# Patient Record
Sex: Male | Born: 1965 | Race: White | Hispanic: No
Health system: Western US, Academic
[De-identification: ages and names within clinical notes are randomized; demographics above are authoritative.]

## PROBLEM LIST (undated history)

## (undated) DIAGNOSIS — I509 Heart failure, unspecified: Secondary | ICD-10-CM

## (undated) DIAGNOSIS — I2510 Atherosclerotic heart disease of native coronary artery without angina pectoris: Secondary | ICD-10-CM

## (undated) DIAGNOSIS — I82C19 Acute embolism and thrombosis of unspecified internal jugular vein: Secondary | ICD-10-CM

## (undated) DIAGNOSIS — N186 End stage renal disease: Secondary | ICD-10-CM

## (undated) DIAGNOSIS — L0390 Cellulitis, unspecified: Secondary | ICD-10-CM

## (undated) DIAGNOSIS — F32A Depression, unspecified: Secondary | ICD-10-CM

## (undated) DIAGNOSIS — G629 Polyneuropathy, unspecified: Secondary | ICD-10-CM

## (undated) DIAGNOSIS — I10 Essential (primary) hypertension: Secondary | ICD-10-CM

## (undated) DIAGNOSIS — G43909 Migraine, unspecified, not intractable, without status migrainosus: Secondary | ICD-10-CM

## (undated) DIAGNOSIS — I82409 Acute embolism and thrombosis of unspecified deep veins of unspecified lower extremity: Secondary | ICD-10-CM

## (undated) DIAGNOSIS — I871 Compression of vein: Secondary | ICD-10-CM

## (undated) HISTORY — PX: TUNNELED VENOUS CATHETER PLACEMENT: SHX818

## (undated) HISTORY — PX: SURGICAL HX OTHER: 99

---

## 2000-03-09 ENCOUNTER — Encounter: Payer: Self-pay | Admitting: Ophthalmology

## 2000-03-13 ENCOUNTER — Encounter (HOSPITAL_BASED_OUTPATIENT_CLINIC_OR_DEPARTMENT_OTHER): Payer: Self-pay | Admitting: Orthopaedic Surgery

## 2011-07-01 ENCOUNTER — Ambulatory Visit: Payer: Medicare Other | Attending: Nephrology | Admitting: Nephrology

## 2011-07-01 DIAGNOSIS — F17200 Nicotine dependence, unspecified, uncomplicated: Secondary | ICD-10-CM | POA: Insufficient documentation

## 2011-07-01 DIAGNOSIS — I120 Hypertensive chronic kidney disease with stage 5 chronic kidney disease or end stage renal disease: Secondary | ICD-10-CM | POA: Insufficient documentation

## 2011-07-01 DIAGNOSIS — N186 End stage renal disease: Secondary | ICD-10-CM

## 2019-10-28 ENCOUNTER — Encounter (HOSPITAL_COMMUNITY): Payer: Self-pay

## 2019-10-29 NOTE — Historical Transplant Studies (Signed)
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

## 2019-10-29 NOTE — Historical Transplant Communications (Signed)
[place], Baumgartner [phone])  Transplant Communications  ____________________________________________________________________________________    Date: 07/24/2011  Type: Medical info  Entry By: Vista Mink  Re: 07/24/11 outside note to scan of type  - Outside Records/General.  ____________________________________________________________________________________    Date: 07/23/2011  Type: Medical info  Entry By: Claud Kelp  Re: 07/23/11 outside note to scan of type  - Outside Records/General.  ____________________________________________________________________________________    Date: 07/15/2011  Type: Action  Entry By: [email or web address]  Closing chart and referral.  ____________________________________________________________________________________    Date: 06/30/2011  Type: Chart in review  Entry By: jharder  TC from pt's girlfriend.  Wondering if they live too far away for the outpt f/u.  They live in [place] and can't afford rent prices closer to [place].  Told them that the pt would have to have very reliable transportation and transport provider if he is to remain in [place].  They will consider their options.  ____________________________________________________________________________________    Date: 06/30/2011  Type: Action  Entry By: Ashley Royalty  Spoke to pt and confirmed tomorrow's appt. Planning to attend as scheduled.  ____________________________________________________________________________________    Date: 06/19/2011  Type: Action  Entry By: [email or web address]  Sending out packet and faxing RMD.  ____________________________________________________________________________________    Date: 06/13/2011  Type: Medical info  Entry By: [email or web address]  Scheduled pt for initial appt on 07/01/11 at 3pm with Dr. Thayer Dallas.  ____________________________________________________________________________________    Date: 06/12/2011  Type: Action  Entry By: veb  [facility] records received.    ____________________________________________________________________________________    Date: 06/11/2011  Type: Action  Entry By: veb  Fax sent to provider. Initial Call- sent to pre-reg. [facility] request made.

## 2020-08-12 ENCOUNTER — Ambulatory Visit: Payer: Medicare PPO

## 2020-08-12 ENCOUNTER — Other Ambulatory Visit (HOSPITAL_COMMUNITY): Payer: Self-pay

## 2020-08-12 ENCOUNTER — Telehealth (HOSPITAL_COMMUNITY): Payer: Self-pay | Admitting: Internal Medicine

## 2020-08-12 ENCOUNTER — Other Ambulatory Visit (HOSPITAL_COMMUNITY): Payer: Medicare PPO

## 2020-08-14 ENCOUNTER — Emergency Department: Payer: Self-pay

## 2020-08-15 ENCOUNTER — Inpatient Hospital Stay: Payer: Self-pay

## 2020-08-26 ENCOUNTER — Emergency Department: Payer: Self-pay

## 2020-09-06 ENCOUNTER — Emergency Department: Payer: Self-pay

## 2020-09-07 ENCOUNTER — Inpatient Hospital Stay: Payer: Self-pay

## 2020-10-08 ENCOUNTER — Emergency Department: Payer: Self-pay

## 2020-10-08 ENCOUNTER — Other Ambulatory Visit (HOSPITAL_COMMUNITY): Payer: Self-pay

## 2020-10-08 ENCOUNTER — Telehealth (HOSPITAL_COMMUNITY): Payer: Self-pay | Admitting: Anesthesiology

## 2020-10-08 ENCOUNTER — Ambulatory Visit: Payer: Medicare PPO

## 2020-10-08 ENCOUNTER — Ambulatory Visit (HOSPITAL_COMMUNITY): Payer: Medicare PPO

## 2020-10-08 NOTE — Telephone Encounter (Signed)
Outside Hospital Transfer to ICU'@[email or web address]'$ @    The patient is currently at [facility] and I spoke with Dr. Burnett Sheng via the Transfer Center at approximately 16:15 today.     REASON FOR TRANSFER REQUEST: Need for dialysis    HPI: Briefly, a 55y/o gentleman w/ ESRD and PD dependence who presented with SOB, hypertensive crisis and fluid overload.  Initially treated with nicardipine and BiPAP with slow improvement.  Planning for discharge and developed sudden somnolence and hypoxemia necessitating ETT placement and mechanical ventilation.  BUN 79 and Cr 18.7.  BNP 70000+.  CT head unremarkable and CT C/A/P pending.  Needs HD and current health care system cannot provide HD or PD.      CRITICAL CARE INTERVENTIONS:   Level of respiratory support: Mech Vent  Level of hemodynamic support: None  Vascular access: CVC    RELEVANT LABS/STUDIES:   As above    ISOLATION:  '[x]'$  No  '[]'$  Yes, Reason: _    NURSING NEEDS:   '[x]'$  No special needs  '[]'$  1:1  '[]'$  Special monitoring (e.g. with certain cardiac meds)  '[]'$  Other: _    PLAN:   '[]'$  The patient has been accepted and has an estimated arrival time of _  '[x]'$  I have declined transfer due to ongoing transfer restrictions in the setting of critically high census.  Discussed utility of continuing to evaluate for availability of other health care systems in the area.      TRANSFER BACK:   The referring provider has verbally agreed to accept the patient in transfer back to his or her facility when the patient's tertiary care needs have been addressed:  '[]'$  Yes  '[]'$  No  '[x]'$  Not discussed    If the patient does not arrive with the necessary medical records, please notify Health Information Management at [phone] to help you obtain them. To coordinate care, I have notified the subspecialist(s), admitting senior resident.

## 2020-10-09 ENCOUNTER — Inpatient Hospital Stay: Payer: Self-pay

## 2020-11-15 ENCOUNTER — Emergency Department: Payer: Self-pay

## 2020-11-16 ENCOUNTER — Inpatient Hospital Stay: Payer: Self-pay

## 2020-12-16 ENCOUNTER — Inpatient Hospital Stay: Payer: Self-pay

## 2020-12-16 ENCOUNTER — Emergency Department: Payer: Self-pay

## 2020-12-16 LAB — COMPREHENSIVE METABOLIC PANEL
ALT (SGPT): 6 U/L — ABNORMAL LOW (ref 7–52)
AST (SGOT), External: 11 U/L — ABNORMAL LOW (ref 13–39)
Albumin, External: 2.6 g/dL — ABNORMAL LOW (ref 3.5–5.7)
Alkaline Phosphatase, Phosphate: 67 U/L (ref 34–104)
Anion Gap, External: 13 mmol/L (ref 5–16)
BUN: 95 mg/dL — ABNORMAL HIGH (ref 7.0–25.0)
Bilirubin Total, External: 0.4 mg/dL (ref 0.3–1.0)
CO2, External: 25 mmol/L (ref 21–31)
Calcium, External: 7.2 mg/dL — ABNORMAL LOW (ref 8.6–10.3)
Chloride, External: 101 mmol/L (ref 98–107)
Creatinine, External: 23.42 mg/dL — ABNORMAL HIGH (ref 0.70–1.30)
Glucose, External: 143 mg/dL — ABNORMAL HIGH (ref 74–109)
Potassium, External: 6.8 mmol/L (ref 3.5–5.1)
Sodium, External: 139 mmol/L (ref 136–145)
Total Protein, External: 5.5 g/dL — ABNORMAL LOW (ref 6.0–8.3)
eGFR, External: 2 mL/min/{1.73_m2} — ABNORMAL LOW (ref >60)

## 2020-12-17 LAB — COMPREHENSIVE METABOLIC PANEL
ALT (SGPT): 4 U/L — ABNORMAL LOW (ref 7–52)
AST (SGOT), External: 8 U/L — ABNORMAL LOW (ref 13–39)
Albumin, External: 2.4 g/dL — ABNORMAL LOW (ref 3.5–5.7)
Alkaline Phosphatase, Phosphate: 63 U/L (ref 34–104)
Anion Gap, External: 10 mmol/L (ref 5–16)
BUN: 51 mg/dL — ABNORMAL HIGH (ref 7.0–25.0)
Bilirubin Total, External: 0.7 mg/dL (ref 0.3–1.0)
CO2, External: 27 mmol/L (ref 21–31)
Calcium, External: 7.2 mg/dL — ABNORMAL LOW (ref 8.6–10.3)
Chloride, External: 98 mmol/L (ref 98–107)
Creatinine, External: 15.16 mg/dL — ABNORMAL HIGH (ref 0.70–1.30)
Glucose, External: 84 mg/dL (ref 74–109)
Potassium, External: 5.2 mmol/L — ABNORMAL HIGH (ref 3.5–5.1)
Sodium, External: 135 mmol/L — ABNORMAL LOW (ref 136–145)
Total Protein, External: 4.9 g/dL — ABNORMAL LOW (ref 6.0–8.3)
eGFR, External: 3 mL/min/{1.73_m2} — ABNORMAL LOW (ref >60)

## 2020-12-18 LAB — COMPREHENSIVE METABOLIC PANEL
ALT (SGPT): 5 U/L — ABNORMAL LOW (ref 7–52)
AST (SGOT), External: 9 U/L — ABNORMAL LOW (ref 13–39)
Albumin, External: 2.4 g/dL — ABNORMAL LOW (ref 3.5–5.7)
Alkaline Phosphatase, Phosphate: 60 U/L (ref 34–104)
Anion Gap, External: 12 mmol/L (ref 5–16)
BUN: 50 mg/dL — ABNORMAL HIGH (ref 7.0–25.0)
Bilirubin Total, External: 0.5 mg/dL (ref 0.3–1.0)
CO2, External: 27 mmol/L (ref 21–31)
Calcium, External: 6.9 mg/dL — ABNORMAL LOW (ref 8.6–10.3)
Chloride, External: 98 mmol/L (ref 98–107)
Creatinine, External: 15.35 mg/dL — ABNORMAL HIGH (ref 0.70–1.30)
Glucose, External: 94 mg/dL (ref 74–109)
Potassium, External: 4.3 mmol/L (ref 3.5–5.1)
Sodium, External: 137 mmol/L (ref 136–145)
Total Protein, External: 5 g/dL — ABNORMAL LOW (ref 6.0–8.3)
eGFR, External: 3 mL/min/{1.73_m2} — ABNORMAL LOW (ref >60)

## 2020-12-19 LAB — COMPREHENSIVE METABOLIC PANEL
ALT (SGPT): 4 U/L — ABNORMAL LOW (ref 7–52)
AST (SGOT), External: 9 U/L — ABNORMAL LOW (ref 13–39)
Albumin, External: 2.4 g/dL — ABNORMAL LOW (ref 3.5–5.7)
Alkaline Phosphatase, Phosphate: 61 U/L (ref 34–104)
Anion Gap, External: 11 mmol/L (ref 5–16)
BUN: 43 mg/dL — ABNORMAL HIGH (ref 7.0–25.0)
Bilirubin Total, External: 0.3 mg/dL (ref 0.3–1.0)
CO2, External: 29 mmol/L (ref 21–31)
Calcium, External: 7.3 mg/dL — ABNORMAL LOW (ref 8.6–10.3)
Chloride, External: 96 mmol/L — ABNORMAL LOW (ref 98–107)
Creatinine, External: 14.45 mg/dL — ABNORMAL HIGH (ref 0.70–1.30)
Glucose, External: 113 mg/dL — ABNORMAL HIGH (ref 74–109)
Potassium, External: 4 mmol/L (ref 3.5–5.1)
Sodium, External: 136 mmol/L (ref 136–145)
Total Protein, External: 5 g/dL — ABNORMAL LOW (ref 6.0–8.3)
eGFR, External: 4 mL/min/{1.73_m2} — ABNORMAL LOW (ref >60)

## 2020-12-20 LAB — COMPREHENSIVE METABOLIC PANEL
ALT (SGPT): 4 U/L — ABNORMAL LOW (ref 7–52)
AST (SGOT), External: 8 U/L — ABNORMAL LOW (ref 13–39)
Albumin, External: 2.6 g/dL — ABNORMAL LOW (ref 3.5–5.7)
Alkaline Phosphatase, Phosphate: 65 U/L (ref 34–104)
Anion Gap, External: 13 mmol/L (ref 5–16)
BUN: 40 mg/dL — ABNORMAL HIGH (ref 7.0–25.0)
Bilirubin Total, External: 0.3 mg/dL (ref 0.3–1.0)
CO2, External: 28 mmol/L (ref 21–31)
Calcium, External: 8.1 mg/dL — ABNORMAL LOW (ref 8.6–10.3)
Chloride, External: 96 mmol/L — ABNORMAL LOW (ref 98–107)
Creatinine, External: 14.64 mg/dL — ABNORMAL HIGH (ref 0.70–1.30)
Glucose, External: 96 mg/dL (ref 74–109)
Potassium, External: 4.2 mmol/L (ref 3.5–5.1)
Sodium, External: 137 mmol/L (ref 136–145)
Total Protein, External: 5.4 g/dL — ABNORMAL LOW (ref 6.0–8.3)
eGFR, External: 4 mL/min/{1.73_m2} — ABNORMAL LOW (ref >60)

## 2020-12-21 LAB — COMPREHENSIVE METABOLIC PANEL
ALT (SGPT): 4 U/L — ABNORMAL LOW (ref 7–52)
AST (SGOT), External: 7 U/L — ABNORMAL LOW (ref 13–39)
Albumin, External: 2.7 g/dL — ABNORMAL LOW (ref 3.5–5.7)
Alkaline Phosphatase, Phosphate: 68 U/L (ref 34–104)
Anion Gap, External: 12 mmol/L (ref 5–16)
BUN: 41 mg/dL — ABNORMAL HIGH (ref 7.0–25.0)
Bilirubin Total, External: 0.3 mg/dL (ref 0.3–1.0)
CO2, External: 29 mmol/L (ref 21–31)
Calcium, External: 8.2 mg/dL — ABNORMAL LOW (ref 8.6–10.3)
Chloride, External: 95 mmol/L — ABNORMAL LOW (ref 98–107)
Creatinine, External: 15.38 mg/dL — ABNORMAL HIGH (ref 0.70–1.30)
Glucose, External: 96 mg/dL (ref 74–109)
Potassium, External: 4.2 mmol/L (ref 3.5–5.1)
Sodium, External: 136 mmol/L (ref 136–145)
Total Protein, External: 5.4 g/dL — ABNORMAL LOW (ref 6.0–8.3)
eGFR, External: 3 mL/min/{1.73_m2} — ABNORMAL LOW (ref >60)

## 2021-01-06 ENCOUNTER — Other Ambulatory Visit (HOSPITAL_COMMUNITY): Payer: Self-pay

## 2021-01-06 ENCOUNTER — Emergency Department: Payer: Self-pay

## 2021-01-07 ENCOUNTER — Other Ambulatory Visit (HOSPITAL_COMMUNITY): Payer: Self-pay

## 2021-01-16 ENCOUNTER — Other Ambulatory Visit (HOSPITAL_COMMUNITY): Payer: Self-pay

## 2021-01-16 ENCOUNTER — Emergency Department: Payer: Self-pay

## 2021-01-17 ENCOUNTER — Other Ambulatory Visit (HOSPITAL_COMMUNITY): Payer: Self-pay

## 2021-01-17 ENCOUNTER — Inpatient Hospital Stay: Payer: Self-pay

## 2021-03-04 ENCOUNTER — Emergency Department: Payer: Self-pay

## 2021-03-05 ENCOUNTER — Emergency Department: Payer: Self-pay

## 2021-03-17 ENCOUNTER — Other Ambulatory Visit (HOSPITAL_COMMUNITY): Payer: Self-pay

## 2021-03-17 ENCOUNTER — Encounter (HOSPITAL_COMMUNITY): Payer: Self-pay

## 2021-03-17 ENCOUNTER — Inpatient Hospital Stay (HOSPITAL_COMMUNITY): Payer: Medicare PPO

## 2021-03-17 ENCOUNTER — Inpatient Hospital Stay (HOSPITAL_COMMUNITY): Payer: Self-pay

## 2021-03-17 ENCOUNTER — Other Ambulatory Visit (HOSPITAL_COMMUNITY): Payer: Self-pay | Admitting: Nurse Practitioner

## 2021-03-17 ENCOUNTER — Inpatient Hospital Stay
Admission: AD | Admit: 2021-03-17 | Discharge: 2021-03-22 | DRG: 280 | Disposition: A | Discharge status: Home / Self Care | Payer: Medicare PPO | Source: Other Acute Inpatient Hospital | Attending: Hospitalist | Admitting: Hospitalist

## 2021-03-17 ENCOUNTER — Emergency Department: Payer: Self-pay

## 2021-03-17 DIAGNOSIS — I132 Hypertensive heart and chronic kidney disease with heart failure and with stage 5 chronic kidney disease, or end stage renal disease: Principal | ICD-10-CM | POA: Diagnosis present

## 2021-03-17 DIAGNOSIS — N186 End stage renal disease: Secondary | ICD-10-CM

## 2021-03-17 DIAGNOSIS — Z86718 Personal history of other venous thrombosis and embolism: Secondary | ICD-10-CM

## 2021-03-17 DIAGNOSIS — Z87891 Personal history of nicotine dependence: Secondary | ICD-10-CM

## 2021-03-17 DIAGNOSIS — I82C13 Acute embolism and thrombosis of internal jugular vein, bilateral: Secondary | ICD-10-CM | POA: Diagnosis present

## 2021-03-17 DIAGNOSIS — I2510 Atherosclerotic heart disease of native coronary artery without angina pectoris: Secondary | ICD-10-CM | POA: Diagnosis present

## 2021-03-17 DIAGNOSIS — Z992 Dependence on renal dialysis: Secondary | ICD-10-CM

## 2021-03-17 DIAGNOSIS — I428 Other cardiomyopathies: Secondary | ICD-10-CM | POA: Diagnosis present

## 2021-03-17 DIAGNOSIS — I10 Essential (primary) hypertension: Secondary | ICD-10-CM

## 2021-03-17 DIAGNOSIS — J9601 Acute respiratory failure with hypoxia: Secondary | ICD-10-CM | POA: Diagnosis present

## 2021-03-17 DIAGNOSIS — K921 Melena: Secondary | ICD-10-CM | POA: Diagnosis present

## 2021-03-17 DIAGNOSIS — J9600 Acute respiratory failure, unspecified whether with hypoxia or hypercapnia: Secondary | ICD-10-CM

## 2021-03-17 DIAGNOSIS — I509 Heart failure, unspecified: Secondary | ICD-10-CM | POA: Diagnosis present

## 2021-03-17 DIAGNOSIS — I5022 Chronic systolic (congestive) heart failure: Secondary | ICD-10-CM

## 2021-03-17 DIAGNOSIS — K922 Gastrointestinal hemorrhage, unspecified: Secondary | ICD-10-CM | POA: Diagnosis present

## 2021-03-17 DIAGNOSIS — Z781 Physical restraint status: Secondary | ICD-10-CM

## 2021-03-17 DIAGNOSIS — E872 Acidosis: Secondary | ICD-10-CM | POA: Diagnosis present

## 2021-03-17 DIAGNOSIS — I5023 Acute on chronic systolic (congestive) heart failure: Secondary | ICD-10-CM | POA: Diagnosis present

## 2021-03-17 DIAGNOSIS — I21A1 Myocardial infarction type 2: Secondary | ICD-10-CM | POA: Diagnosis present

## 2021-03-17 DIAGNOSIS — Z7901 Long term (current) use of anticoagulants: Secondary | ICD-10-CM

## 2021-03-17 DIAGNOSIS — D631 Anemia in chronic kidney disease: Secondary | ICD-10-CM | POA: Diagnosis present

## 2021-03-17 DIAGNOSIS — R778 Other specified abnormalities of plasma proteins: Secondary | ICD-10-CM

## 2021-03-17 DIAGNOSIS — N2581 Secondary hyperparathyroidism of renal origin: Secondary | ICD-10-CM | POA: Diagnosis present

## 2021-03-17 DIAGNOSIS — Z9115 Patient's noncompliance with renal dialysis: Secondary | ICD-10-CM

## 2021-03-17 DIAGNOSIS — I161 Hypertensive emergency: Secondary | ICD-10-CM | POA: Diagnosis present

## 2021-03-17 DIAGNOSIS — Z20822 Contact with and (suspected) exposure to covid-19: Secondary | ICD-10-CM | POA: Diagnosis present

## 2021-03-17 HISTORY — DX: Compression of vein: I87.1

## 2021-03-17 HISTORY — DX: Depression, unspecified: F32.A

## 2021-03-17 HISTORY — DX: Atherosclerotic heart disease of native coronary artery without angina pectoris: I25.10

## 2021-03-17 HISTORY — DX: Cellulitis, unspecified: L03.90

## 2021-03-17 HISTORY — DX: Polyneuropathy, unspecified: G62.9

## 2021-03-17 HISTORY — DX: Essential (primary) hypertension: I10

## 2021-03-17 HISTORY — DX: End stage renal disease: N18.6

## 2021-03-17 HISTORY — DX: Acute embolism and thrombosis of unspecified internal jugular vein: I82.C19

## 2021-03-17 HISTORY — DX: Migraine, unspecified, not intractable, without status migrainosus: G43.909

## 2021-03-17 HISTORY — DX: Acute embolism and thrombosis of unspecified deep veins of unspecified lower extremity: I82.409

## 2021-03-17 HISTORY — DX: Heart failure, unspecified: I50.9

## 2021-03-17 LAB — CBC, DIFF
% Basophils: 0 %
% Eosinophils: 1 %
% Immature Granulocytes: 0 %
% Lymphocytes: 12 %
% Monocytes: 9 %
% Neutrophils: 78 %
% Nucleated RBC: 0 %
Absolute Eosinophil Count: 0.06 10*3/uL (ref 0.00–0.50)
Absolute Lymphocyte Count: 0.64 10*3/uL — ABNORMAL LOW (ref 1.00–4.80)
Basophils: 0.02 10*3/uL (ref 0.00–0.20)
Hematocrit: 21 % — ABNORMAL LOW (ref 38.0–50.0)
Hemoglobin: 6.6 g/dL — ABNORMAL LOW (ref 13.0–18.0)
Immature Granulocytes: 0.02 10*3/uL (ref 0.00–0.05)
MCH: 28.8 pg (ref 27.3–33.6)
MCHC: 31.7 g/dL — ABNORMAL LOW (ref 32.2–36.5)
MCV: 91 fL (ref 81–98)
Monocytes: 0.48 10*3/uL (ref 0.00–0.80)
Neutrophils: 4.14 10*3/uL (ref 1.80–7.00)
Nucleated RBC: 0 10*3/uL
Platelet Count: 158 10*3/uL (ref 150–400)
RBC: 2.29 10*6/uL — ABNORMAL LOW (ref 4.40–5.60)
RDW-CV: 14.5 % — ABNORMAL HIGH (ref 11.6–14.4)
WBC: 5.36 10*3/uL (ref 4.3–10.0)

## 2021-03-17 LAB — CBC (HEMOGRAM)
Hematocrit: 20 % — ABNORMAL LOW (ref 38.0–50.0)
Hemoglobin: 6.5 g/dL — ABNORMAL LOW (ref 13.0–18.0)
MCH: 29.8 pg (ref 27.3–33.6)
MCHC: 32.3 g/dL (ref 32.2–36.5)
MCV: 92 fL (ref 81–98)
Platelet Count: 137 10*3/uL — ABNORMAL LOW (ref 150–400)
RBC: 2.18 10*6/uL — ABNORMAL LOW (ref 4.40–5.60)
RDW-CV: 14.4 % (ref 11.6–14.4)
WBC: 4.36 10*3/uL (ref 4.3–10.0)

## 2021-03-17 LAB — ANTI XA FOR APIXABAN LEVEL: APIXABAN LEVEL: <20 ng/mL

## 2021-03-17 LAB — COMPREHENSIVE METABOLIC PANEL
ALT (GPT): 6 U/L — ABNORMAL LOW (ref 10–48)
AST (GOT): 11 U/L (ref 9–38)
Albumin: 2.5 g/dL — ABNORMAL LOW (ref 3.5–5.2)
Alkaline Phosphatase (Total): 91 U/L (ref 39–139)
Anion Gap: 22 — ABNORMAL HIGH (ref 4–12)
Bilirubin (Total): 0.3 mg/dL (ref 0.2–1.3)
Calcium: 6.5 mg/dL — ABNORMAL LOW (ref 8.9–10.2)
Carbon Dioxide, Total: 20 meq/L — ABNORMAL LOW (ref 22–32)
Chloride: 98 meq/L (ref 98–108)
Creatinine: 18.76 mg/dL — ABNORMAL HIGH (ref 0.51–1.18)
Glucose: 55 mg/dL — ABNORMAL LOW (ref 62–125)
Potassium: 5.2 meq/L (ref 3.6–5.2)
Protein (Total): 5.1 g/dL — ABNORMAL LOW (ref 6.0–8.2)
Sodium: 140 meq/L (ref 135–145)
Urea Nitrogen: 91 mg/dL — ABNORMAL HIGH (ref 8–21)
eGFR by CKD-EPI 2021: 3 mL/min/{1.73_m2} — ABNORMAL LOW (ref >59)

## 2021-03-17 LAB — PROTHROMBIN TIME
Prothrombin INR: 1.2 (ref 0.8–1.3)
Prothrombin Time Patient: 14.8 s (ref 10.7–15.6)

## 2021-03-17 LAB — PARTIAL THROMBOPLASTIN TIME: Partial Thromboplastin Time: 33 s (ref 22–35)

## 2021-03-17 LAB — MAGNESIUM: Magnesium: 1.9 mg/dL (ref 1.8–2.4)

## 2021-03-17 LAB — BLOOD GAS, ARTERIAL, W/ LACT
Base Deficit Blood, ART: 3.7 meq/L — ABNORMAL HIGH (ref 0.0–2.0)
Bicarbonate: 21 meq/L — ABNORMAL LOW (ref 22–26)
L Lactate (Direct), ART WB: 0.6 mmol/L (ref 0.4–1.0)
O2 Saturation: 100 % — ABNORMAL HIGH (ref 95–99)
pCO2, ART: 34 mmHg (ref 33–48)
pH, ART: 7.4 (ref 7.35–7.45)
pO2: 174 mmHg — ABNORMAL HIGH (ref 70–95)

## 2021-03-17 LAB — LAB ADD ON ORDER

## 2021-03-17 LAB — BLOOD TYPE CONFIRMATION: ABO/Rh: A POS

## 2021-03-17 LAB — BLOOD GAS, ARTERIAL (NO ELECTROLYTES)
Base Deficit Blood, ART: 3.6 meq/L — ABNORMAL HIGH (ref 0.0–2.0)
Bicarbonate: 20 meq/L — ABNORMAL LOW (ref 22–26)
O2 Saturation: 100 % — ABNORMAL HIGH (ref 95–99)
pCO2, ART: 31 mmHg — ABNORMAL LOW (ref 33–48)
pH, ART: 7.43 (ref 7.35–7.45)
pO2: 161 mmHg — ABNORMAL HIGH (ref 70–95)

## 2021-03-17 LAB — CALCIUM (IONIZED), WB: Calcium (Ionized): 0.84 mmol/L — ABNORMAL LOW (ref 1.18–1.38)

## 2021-03-17 LAB — B_TYPE NATRIURETIC PEPTIDE: B_Type Natriuretic Peptide: 2381 pg/mL — ABNORMAL HIGH (ref <101)

## 2021-03-17 LAB — BILIRUBIN (DIRECT): Bilirubin (Direct): 0 mg/dL (ref 0.0–0.3)

## 2021-03-17 MED ORDER — ONDANSETRON HCL 4 MG OR TABS
4.0000 mg | ORAL_TABLET | Freq: Three times a day (TID) | ORAL | Status: DC | PRN
Start: 2021-03-17 — End: 2021-03-22

## 2021-03-17 MED ORDER — GENTAMICIN SULFATE 0.1 % EX CREA
1.0000 | TOPICAL_CREAM | CUTANEOUS | Status: DC | PRN
Start: 2021-03-17 — End: 2021-03-20
  Administered 2021-03-18: 1 via TOPICAL
  Filled 2021-03-17: qty 15

## 2021-03-17 MED ORDER — CALCIUM CARBONATE ANTACID 500 MG OR CHEW
1000.0000 mg | CHEWABLE_TABLET | Freq: Three times a day (TID) | ORAL | Status: DC | PRN
Start: 2021-03-17 — End: 2021-03-22

## 2021-03-17 MED ORDER — FENTANYL CITRATE (PF) 100 MCG/2ML IJ SOLN
12.5000 ug | INTRAMUSCULAR | Status: DC | PRN
Start: 2021-03-17 — End: 2021-03-18
  Administered 2021-03-18 (×2): 25 ug via INTRAVENOUS
  Filled 2021-03-17 (×2): qty 2

## 2021-03-17 MED ORDER — CHLORHEXIDINE GLUCONATE 0.12 % MT SOLN
15.0000 mL | Freq: Two times a day (BID) | OROMUCOSAL | Status: DC
Start: 2021-03-17 — End: 2021-03-18
  Administered 2021-03-17 – 2021-03-18 (×2): 15 mL via ORAL
  Filled 2021-03-17: qty 15

## 2021-03-17 MED ORDER — ONDANSETRON HCL 4 MG/2ML IJ SOLN
4.0000 mg | Freq: Three times a day (TID) | INTRAMUSCULAR | Status: DC | PRN
Start: 2021-03-17 — End: 2021-03-22

## 2021-03-17 MED ORDER — LIDOCAINE HCL 1 % IJ SOLN
1.0000 mL | Freq: Once | INTRAMUSCULAR | Status: DC
Start: 2021-03-17 — End: 2021-03-17
  Filled 2021-03-17: qty 1

## 2021-03-17 MED ORDER — HEPARIN SODIUM (PORCINE) 5000 UNIT/ML IJ SOLN
5000.0000 [IU] | Freq: Three times a day (TID) | INTRAMUSCULAR | Status: DC
Start: 2021-03-17 — End: 2021-03-17

## 2021-03-17 MED ORDER — LIDOCAINE HCL (PF) 1 % IJ SOLN
1.0000 mL | Freq: Once | INTRAMUSCULAR | Status: AC
Start: 2021-03-17 — End: 2021-03-17
  Administered 2021-03-17: 1 mL via INTRADERMAL

## 2021-03-17 MED ORDER — PROPOFOL 1000 MG/100ML IV EMUL
0.0000 ug/kg/min | INTRAVENOUS | Status: DC
Start: 2021-03-17 — End: 2021-03-18
  Administered 2021-03-17 (×2): 70 ug/kg/min via INTRAVENOUS
  Administered 2021-03-18: 50 ug/kg/min via INTRAVENOUS
  Administered 2021-03-18: 10 ug/kg/min via INTRAVENOUS
  Administered 2021-03-18: 50 ug/kg/min via INTRAVENOUS
  Administered 2021-03-18: 45 ug/kg/min via INTRAVENOUS
  Administered 2021-03-18: 50 ug/kg/min via INTRAVENOUS
  Filled 2021-03-17 (×7): qty 100

## 2021-03-17 MED ORDER — NITROGLYCERIN IN D5W 200-5 MCG/ML-% IV SOLN
0.0000 ug/kg/min | INTRAVENOUS | Status: DC
Start: 2021-03-17 — End: 2021-03-17
  Administered 2021-03-17: 0.1 ug/kg/min via INTRAVENOUS
  Filled 2021-03-17: qty 250

## 2021-03-17 MED ORDER — PANTOPRAZOLE SODIUM 40 MG IV SOLR
40.0000 mg | Freq: Two times a day (BID) | INTRAVENOUS | Status: DC
Start: 2021-03-18 — End: 2021-03-19
  Administered 2021-03-18 – 2021-03-19 (×3): 40 mg via INTRAVENOUS
  Filled 2021-03-17 (×3): qty 10

## 2021-03-17 MED ORDER — PROPOFOL 1000 MG/100ML IV EMUL
0.0000 ug/kg/min | INTRAVENOUS | Status: DC
Start: 2021-03-17 — End: 2021-03-17
  Administered 2021-03-17: 70 ug/kg/min via INTRAVENOUS
  Filled 2021-03-17: qty 100

## 2021-03-17 MED ORDER — SENNOSIDES 8.6 MG OR TABS
17.2000 mg | ORAL_TABLET | Freq: Two times a day (BID) | ORAL | Status: DC | PRN
Start: 2021-03-17 — End: 2021-03-22

## 2021-03-17 MED ORDER — NITROGLYCERIN IN D5W 200-5 MCG/ML-% IV SOLN
0.0000 ug/kg/min | INTRAVENOUS | Status: DC
Start: 2021-03-17 — End: 2021-03-19
  Administered 2021-03-17: 0.1 ug/kg/min via INTRAVENOUS
  Administered 2021-03-18: 1 ug/kg/min via INTRAVENOUS
  Administered 2021-03-18: 1.3 ug/kg/min via INTRAVENOUS
  Filled 2021-03-17 (×2): qty 250

## 2021-03-17 MED ORDER — SODIUM CHLORIDE 0.9 % IV SOLN
10.0000 mL/h | INTRAVENOUS | Status: DC
Start: 2021-03-17 — End: 2021-03-22
  Administered 2021-03-17: 10 mL/h via INTRAVENOUS

## 2021-03-17 MED ORDER — SEVELAMER CARBONATE 800 MG OR TABS
1600.0000 mg | ORAL_TABLET | Freq: Three times a day (TID) | ORAL | Status: DC
Start: 2021-03-18 — End: 2021-03-22
  Administered 2021-03-18 – 2021-03-22 (×11): 1600 mg via ORAL
  Filled 2021-03-17 (×11): qty 2

## 2021-03-17 MED ORDER — FENTANYL CITRATE (PF) 100 MCG/2ML IJ SOLN
12.5000 ug | INTRAMUSCULAR | Status: DC | PRN
Start: 2021-03-17 — End: 2021-03-18

## 2021-03-17 MED ORDER — FENTANYL CITRATE (PF) 100 MCG/2ML IJ SOLN
25.0000 ug | INTRAMUSCULAR | Status: AC | PRN
Start: 2021-03-17 — End: 2021-03-17

## 2021-03-17 MED ORDER — PROPOFOL BOLUS FROM PUMP
10.0000 mg | Status: DC | PRN
Start: 2021-03-17 — End: 2021-03-18

## 2021-03-17 NOTE — Progress Notes (Signed)
03/17/21 1950   Vent Information   Ventilation Day(s) 1   Vent ID [id number]   O2 Delivery Method Ventilator   Vent Mode VC/AC   Ventilator On Yes   $Vent Charge Initial (ONE TIME ONLY) Yes   Vent System Check Yes   Settings   SpO2 98 %   FiO2 (%) 30 %   Resp Rate (Set) 20   Vt (Set, mL) 600 mL   PEEP/CPAP (cm H2O) 10 cm H20   Insp Time (sec) 0.75 sec   I:E Ratio 1:3   Insp Flow (L/min) 69 L/min   Waveform Decelerating ramp   Trigger Sensitivity Flow (L/min) 3 L/min   Humidification Heat and moisture exchanger   Rise Time 0.15 cmH2O   Readings   PEEP Observed (cm H2O) 11 cm H2O   Delta P (cm H2O) 14   Driving Pressure (cm H2O) 7   Resp Rate Observed 20   Vt (observed, mL) 602 mL   PIP Observed (cm H2O) 25 cm H2O   Plateau Pressure (cm H2O) 18 cm H2O   Minute Ventilation (L/min) 12 L/min   MAP (cm H2O) 14   Alarms   Insp Pressure High (cm H2O) 60 cm H2O   MV High (L/min) 20 L/min   MV Low (L/min) 5 L/min   Daily Screen   ROX Index 16.33   Weaning Parameters   Resp 20   ETT 03/17/21 Oral   Placement Date: 03/17/21   Placed by External Staff?: Other hospital  Single Lumen Tube Size: 8 mm  Location: Oral   Cuff Pressure (cm H2O) 28 cm H2O   Secured at (cm) 26 cm   Measured from Teeth/Gums   [employer]   Secured by Charity fundraiser  ([employer] dated 8/14)   Site Condition Cool;Dry   Patient admitted to [facility] ICU from an outside hospital. Patient placed on vent at documented settings. Plan for chest x-ray and ABG. RCP will continue to monitor patient.

## 2021-03-17 NOTE — Procedures (Addendum)
Central Line    Date/Time: 03/17/2021 11:47 PM  Performed by: Cleon Gustin, MD  Authorized by: Cleon Gustin, MD     Consent:     Consent obtained:  Verbal    Consent given by:  Healthcare agent    Risks discussed:  Arterial puncture, incorrect placement, pneumothorax, bleeding and infection    Indications:  Central pressure monitoring  Pre-procedure details:     Hand hygiene: Hand hygiene performed prior to insertion      Sterile barrier technique: All elements of maximal sterile technique followed      Skin preparation:  2% chlorhexidine    Skin preparation agent: Skin preparation agent completely dried prior to procedure    Sedation:     Sedation type:  Deep  Anesthesia (see MAR for exact dosages):     Anesthesia method:  Local infiltration    Local anesthetic:  Lidocaine 1% w/o epi  Procedure details:     Location:  R internal jugular    Patient position:  Trendelenburg    Procedural supplies:  Triple lumen    Catheter size:  7 Fr    Landmarks identified: yes      Ultrasound guidance: yes      Sterile ultrasound techniques: Sterile gel and sterile probe covers were used      Number of attempts:  1    Successful placement: no    Post-procedure details:     Patient tolerance of procedure:  Tolerated well, no immediate complications  Comments:      Although before the procedure, bedside U/S revealed compressable RIJ with blood flow in it, upon attempting to enter the vein with the introducer needle, performing U/S along the projected path of the needle did not reveal expected continuation of the RIJ towards the SVC. Further U/S showed rapid taper of the RIJ towards a smaller, likely occluded part. Procedure was aborted. The vein was not accessed, nor wire was inserted.  Dx: acute respiratory failure.

## 2021-03-17 NOTE — Procedures (Signed)
Insert Arterial Line    Date/Time: 03/17/2021 9:49 PM  Performed by: Cleon Gustin, MD  Authorized by: Cleon Gustin, MD     Consent:     Consent obtained:  Emergent situation  Indications:     Indications: hemodynamic monitoring and multiple ABGs    Pre-procedure details:     Skin preparation:  2% Chlorhexidine  Sedation:     Sedation type:  Deep  Anesthesia (see MAR for exact dosages):     Anesthesia method:  Local infiltration    Local anesthetic:  Lidocaine 1% w/o epi  Procedure details:     Location:  R radial    Allen's test performed: no      Needle gauge:  18 G    Placement technique:  Ultrasound guided    Number of attempts:  1  Post-procedure details:     Post-procedure:  Biopatch applied, secured with tape, sterile dressing applied and sutured    Patient tolerance of procedure:  Tolerated well, no immediate complications  Comments:      Dx: acute respiratory failure

## 2021-03-17 NOTE — H&P (Signed)
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 Called EMS due to SOB, requiring intubation in the field, gastroccult positive now transferring to [facility] for further management.      SUBJECTIVE   HISTORY OF PRESENT ILLNESS:   Obtained from chart review due to patient acuity.    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 Additional history includes NICM, non-occlusive CAD with coronary angiogram in April (see results below) and echocardiogram (11/16/20) with mildly reduced EF 40-45%    In June 2022 he presented to the ED for grand mal seizure with fall and right frontal head injury. His workup with CT head and brain MRI were ultimately negative for acute CNS process; however patient had a prolonged post ictal/confused period.    On 03/17/21 he presented to [facility] with dyspnea and was intubated for respiratory failure. His K was 4.8, AG 20, Scr 18.35, Hgb 10 (03/04/21 was 8.7), pH 7.09, pCO2 67, bicarb 22; covid neg, trop 118 (n < 45), nt-pro BNP > 70 000.     Of note review of care everywhere reveals Hgb 7.7 (02/08/21), 7.8 (02/06/21), 8.8 (02/04/21), 8.3 (02/03/21)).    Blood was noted in his OG tube, content was guaiac positive. Protonix bolus was given. COVID negative.  NGL was initiated for HTN (SBP > 217mHg) and [facility].    Central access was not done due to known bilateral IJ thrombosis, right  subclavian and right femoral.    [facility]. During transport, BP trending up requiring higher titration of his NGL and propofol.    Review of Systems   Unable to perform ROS: Intubated (intubated/sedated)       OUTPATIENT MEDICATIONS:   Current Outpatient Medications   Medication Instructions    amLODIPine (NORVASC) 10 mg, Oral, Daily    calcitriol (ROCALTROL) 0.25 mcg, Oral, Daily    cloNIDine (CATAPRES) 0.2 mg, Oral    gabapentin (NEURONTIN) 100 mg, Oral    hydrALAZINE 50 MG tablet Oral       ALLERGIES:   Ceftaroline, Ceftazidime, Codeine, Lisinopril, and Penicillins     IR Venous Catheter Port 01/24/21:    1. Successful ultrasound and fluoroscopy guided placement of a 31 cm 14.5 French double-lumen tunneled hemodialysis catheter in the left lateral side via a left common femoral vein access, with its catheter tip at the lower IVC. Ready for use.   2. 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 WOULD RECOMMEND MAINTAINING HIS CURRENT INDWELLING ACCESS IN   THE LEFT COMMON FEMORAL VEIN AND PERFORMING ANY FUTURE CATHETER EXCHANGES OVER THE WIRE TO PREVENT LOSS OF CENTRAL VENOUS ACCESS     CT Head 01/17/2021:  IMPRESSION:   No intracranial hemorrhage. Fluid in  the inferior left mastoid air cells. Examination is limited by motion artifact.     MRI Brain 01/07/2021:  MRI HEAD:   1.Motion artifact technically limits evaluation.   2. When accounting for technical limitations no definite acute infarct, acute intracranial hemorrhage, mass, hydrocephalus, or midline shift.   3. Mild to moderate white matter changes that are nonspecific but may represent sequela of chronic small vessel ischemic disease.   4. Small right and large left mastoid effusions.   5. There appears to be left middle ear effusion.     MRA HEAD:   1.No large vessel  occlusion.   2. No aneurysm. No significant narrowing.     Coronary Angiogram 11/18/2020:  Findings:   Hemodynamics: The left ventricular and systemic blood pressures are   within normallimits and no significant aortic valve gradient was seen   Right heart cath:  RA: 8  RV: 29/4  PA: 28/13  PCWP: 11  CO: 7.9   (Fick)  CI: 4.0   Coronary anatomy: A right dominant coronary anatomy is seen, thus   providing the AV nodal and posterior descending arteries. Fluoroscopy   reveals moderate, calcification of the proximal coronary vessels.   Left main artery: The left main artery is a moderate-sized vessel.   Angiography reveals  minor proximal plaque, but no significant stenosis.      Left anterior descending: The left anterior descending artery is a normal   sized vessel terminating at the apex, providing the majority of septal   perfusion branches. Mild midd-vessel narrowing to 30%, no focal occlusive   disiease.      Circumflex: The circumflex artery is a moderate-sized vessel. Angiography   reveals ostial stenosis arisingg from calcified left main, but does not   appear significant, roughly 40%.  Right coronary artery: The right coronary artery is a moderate-sized   vessel. Angiography reveals no significant luminal disease.   LV gram: Not performed.     Conclusions / Recommendations:   Well compensated hemodynamics, normal cardiac output.   No occlusive CAD.     TTE 11/16/2020:   The left ventricular size is normal.   The left ventricular function is mildly decreased.   The estimated ejection fraction is 40-45%.   The left ventricular wall thickness is mildly increased.    The right ventricular size and function are normal.   The aortic valve has minimal degenerative changes and no significant stenosis.   There is trace aortic regurgitation.   The mitral valve leaflets are thickened.   There is mild mitral regurgitation.   There is mild pulmonary hypertension.   The estimated pulmonary artery systolic  pressure is 58NIDP.   There are changes noted when compared tothe previous study done on   05/20/2019. On this date the LVEF was 50-55% with normal LV diastolic   function. There was mild biatrial enlargement.   When compared to the previous study done on 05/20/2019, LVEF has   decreased.     TTE 10/09/2020:  Left Ventricle   Normal left ventricle chamber size. Mild concentric left ventricular hypertrophy. Left ventricular ejection fraction is 55%. No regional wall motion  abnormalities. Moderate (Gr. II) left ventricular diastolic relaxation abnormality is demonstrated.     Right Ventricle   Normal right ventricular size and function. TAPSE distance is 28 mm. RV S' Vel 14 cm/sec.     Atria   The left atrium is moderately dilated. The left atrial volume indexed to BSA is calculated to be 42  cc/m2. The right atrium is normal in size.     Mitral Valve   Moderate sclerocalcific changes of the mitral valve are present. Moderate mitral annulus calcification. Trace mitral regurgitation is present.     Aorta / Aortic Valve   Trileaflet aortic valve. Normal aortic root diameter. Maximum ascending aorta diameter is 4.2cm. Trace aortic insufficiency is demonstrated.     Tricuspid Valve   Normal tricuspid valve appearance. Mild tricuspid regurgitation is present. Peak estimated right ventricular pressure is 28 mmHg. Estimated RA pressure is 3 mmHg.     Pulmonic Valve   The pulmonic valve is not adequately visualized.     Pericardium   A trace pericardial effusion is present.      OBJECTIVE     Vitals (Arrival)      T: (not recorded)  BP: (not recorded)  HR: (not recorded)  RR: (not recorded)  SpO2: (not recorded)     Vitals (Most recent in last 24 hrs)   T: (not recorded)  BP: (not recorded)  HR: (not recorded)  RR: (not recorded)  SpO2: (not recorded)    T range: No data recorded  (no weight taken for this visit)     (no height taken for this visit)     There is no height or weight on file to calculate BMI.       Physical  Exam  Constitutional:       Interventions: He is sedated and intubated.   Cardiovascular:      Rate and Rhythm: Normal rate and regular rhythm.      Heart sounds: Normal heart sounds.      Comments: JVD: to the jaw.  Pulmonary:      Effort: He is intubated.      Breath sounds: Normal breath sounds.   Musculoskeletal:      Right lower leg: No edema.      Left lower leg: No edema.         EKG: SR, normal axis, clock-wise rotation of the transition zone.  X-ray chest (personal read): no congestion, no infiltrate.    Labs (last 24 hours):   Chemistries  CBC  LFT  Gases, other   - - - -   -   AST: - ALT: -  -/-/-/-   -/-/-/-   - - -   - >< -  AP: - T bili: -  Lact (a): - Lact (v): -   eGFR: - Ca: -   -   Prot: - Alb: -  Trop I: - D-dimer: -   Mg: - PO4: -  ANC: -     BNP: - Anti-Xa: -     ALC: -    INR: -      Data Review:      Reviewed Results? Independently visualized & interpreted? Key Findings     Lab [x]  [x]     Radiology [x]  [x]     EKG/Tele/Echo  [x]  [x]     Other?  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    1. Acute respiratory failure  - in the setting of volume overload and HTN emergency  - titrate NGL to keep SBP <  85 mmHg  - add hydralazine prn  - titrate down FiO2 and PEEP  - SAT/SBT in a.m.  - PD as below  - known HFrEF  - TTE at beside by me: LV small, LV EF ~40-45%, normal RV size and function, dilated noncollapsible IVC, no MR/AoR noted.    2. ESRD on PD  - appreciate nephrology consult, proceed with PD tonight  - sevelamer    3. Acute GI bleed,  anemia of chronic disease  - iron panel  - protonix 40 IV bid  - NPO  - consider GI consult in a.m  - transfuse 1 unit and f/u Hgb (6.5 on admission, his base is around 7.7-8.8 (data from 02/2021))    4.  Elevated troponin  - f/u   - likely demand mediated in the setting of HTN and also deceased clearance in ESRD    5. HTN  - c/e NGL  - add and titrate up hydralzine     5.  DVT prophylaxis  - SCDs    6.Full code.    Cardiology Critical Care Addendum    Critical care is necessary because the patient requires continuous physician observation and interventions in an ICU setting to respond to emergent changes in their medical condition and to prevent further deterioration in their clinical state.    I personally examined the patient today and reviewed the patient's clinical course, laboratory data, and:  [x]  Radiological studies  []  Cath films   [x]  Echo   [x]  Ventilator parameters   []  Hemodynamic data    The following conditions and interventions require complex, high-level decision-making and contribute to the high probability of acute, clinically significant deterioration:    DIAGNOSES:   []  close observation and management of cardiogenic shock and/or post-procedural management with the following:   []  Inotropes/pressors   []  Intra-aortic balloon pump   []  Ventricular assist device   []  Intravascular volume resuscitation   []  Invasive hemodynamic monitoring  []  Close observation and management of patient with, or at high risk for recurrence of, hemodynamically-significant arrhythmias  [x]  Close observation and management of acute respiratory failure with the following:   []  CPAP/BIPAP or HFNC   [x]  Mechanical ventilation  []  Management of acute kidney injury or acute renal failure with:  []  Close observation and management of volume status and hemodynamics  []  Renal replacement therapy  []  Close observation and management of an acute neurological condition  []  Intravascular volume resuscitation for shock as documented below  []  Vasopressors for shock as documented below    I spent a total of 68 minutes personally providing critical care and formulating a plan for the day, independent of any time spent teaching or  performing any separately billable procedures. The above statements represent my decisions unless otherwise indicated.      DATE OF SERVICE: 03/17/2021    Hristo Donetta Potts, MD         ICU Checklist:    COVID-19 surveillance: ordered  Delirium prevention/therapy: minitor  Fluids/electrolytes: on PD  Diet: No diet orders on fileNPO  Minimum mobility goal:   DVT Prophylaxis: scds  GI Prophylaxis: protonix  Glucose control: minotor  Lines/Drains/Airways: L femoral CVC  Labs: f/u  Spine Precautions: n/a  Antibiotic stewardship: no abx  Disposition: ccu   Code Status: Full Code    Interim Summary Due Date: 03/24/21  Contacts: Primary Emergency Contact: [place], Home Phone: [phone]  Next of Kin: STUBER,MICHAEL

## 2021-03-17 NOTE — Procedures (Addendum)
Central Line    Date/Time: 03/17/2021 11:59 PM  Performed by: Cleon Gustin, MD  Authorized by: Cleon Gustin, MD     Consent:     Consent obtained:  Verbal    Consent given by:  Healthcare agent    Risks discussed:  Arterial puncture, incorrect placement and bleeding    Indications:  Vascular access  Pre-procedure details:     Hand hygiene: Hand hygiene performed prior to insertion      Sterile barrier technique: All elements of maximal sterile technique followed      Skin preparation:  2% chlorhexidine    Skin preparation agent: Skin preparation agent completely dried prior to procedure    Sedation:     Sedation type:  Deep  Anesthesia (see MAR for exact dosages):     Anesthesia method:  Local infiltration    Local anesthetic:  Lidocaine 1% w/o epi  Procedure details:     Location:  L femoral    Patient position:  Flat    Procedural supplies:  Triple lumen    Catheter size:  7 Fr    Landmarks identified: yes      Ultrasound guidance: yes      Sterile ultrasound techniques: Sterile gel and sterile probe covers were used      Number of attempts:  1    Successful placement: yes    Post-procedure details:     Post-procedure:  Dressing applied and line sutured    Assessment:  Blood return through all ports and free fluid flow    Patient tolerance of procedure:  Tolerated well, no immediate complications  Comments:      Dx: acute respiratory failure.   Line is OK to use, no need to verify by X-ray (since it is a femoral line).

## 2021-03-17 NOTE — Consults (Signed)
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    Per note and primary team, patient called EMS for DOE/SOB, decompensated on EMS arrival and had to be intubated in the field.     Seen and examined, intubated, sedated. On nitro gtt.     Per note in care everywhere, patient had DVT/clotts in his IJ and subclavian vessels. Was HD then PD due to access issues? 2 failed AVF in the past.     Review of Systems  Unable to obtain due to being intubated and sedated         MEDICATIONS:   SCHEDULED MEDICATIONS:   chlorhexidine gluconate, 15 mL, BID    [START ON 03/18/2021] pantoprazole, 40 mg, q12h    [START ON 03/18/2021] sevelamer carbonate, 1,600 mg, TID with meals    INFUSED MEDICATIONS:  nitroGLYCERIN, 0-4 mcg/kg/min (Dosing Weight), Continuous, Last Rate: 0.5 mcg/kg/min (03/17/21 2200)    propofol, 0-80 mcg/kg/min (Dosing Weight), Continuous, Last Rate: 70 mcg/kg/min (03/17/21 2256)    sodium chloride, 10 mL/hr, Continuous, Last Rate: 10 mL/hr (03/17/21 2056)     PRN MEDICATIONS:    calcium carbonate, 1,000 mg, TID PRN    fentaNYL PF, 12.5-25 mcg, q30 min PRN    fentaNYL PF, 12.5-25 mcg, q5 min PRN    gentamicin, 1 application, PRN    ondansetron, 4 mg, q8h PRN    ondansetron, 4 mg, q8h PRN    propofol, 10-50 mg, PRN    senna, 17.2 mg,  BID PRN      ALLERGIES:   Ceftaroline, Codeine, Lisinopril, and Penicillins     OBJECTIVE     Vitals (Arrival)      T: (!) 35.8 C (03/17/21 2000)  BP: 109/65 (03/17/21 2000)  HR: 69 (03/17/21 1950)  RR: 20 (03/17/21 1950)  SpO2: 98 % (03/17/21 1950) Ventilator   30 %   Vitals (Most recent in last 24 hrs)   T: (!) 35.8 C (03/17/21 2200)  BP: 109/65 (03/17/21 2000)  HR: 63 (03/17/21 2215)  RR: 20 (03/17/21 2200)  SpO2: 98 % (03/17/21 2215) Ventilator   21 %  T range: Temp  Min: 35.8 C  Max: 35.8 C  Wt 152 lb 1.9 oz (69 kg)     Ht 5' 10.866" (1.8 m)     Body mass index is 21.3 kg/m.       Physical Exam  General: Well developed, sedated   HEENT: NC/AT, +ET tube ,   Neck: Supple, without lesions, thyroid non-enlarged and non-tender  Heart: regular rate and rhythm, no murmur or gallop, no edema   Lungs: ventilated lung sound   Abdomen: +BS, non-tender, non distended, + PD catheter, mild erythema around the site, no exudates   Extremities:  No amputations or deformities, cyanosis, peripheral pulses +   Neurologic:  No focal deficits  Skin: warm and dry, no rash          Labs (last 24 hours):   Chemistries  CBC  LFT  Gases, other   140 98 91 55   6.5   AST: 11 ALT: 6  7.40/-/-/21   -/-/-/-   5.2 20 18.76   4.36 >< 137  AP: 91 T bili: 0.3  Lact (a): - Lact (v): -   eGFR: 3 Ca: 6.5   20   Prot: 5.1 Alb: 2.5  Trop I: - D-dimer: -   Mg: 1.9 PO4: -  ANC: 4.14     BNP: 2,381 Anti-Xa: -     ALC: 0.64    INR: 1.2                ASSESSMENT/PLAN           Casey Wong with PMH of PMH 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 Currently in CCU, intubated, on nitro gtt    #ESRD   -unclear etiology, on PD  -unsure home PD rx  -will resume PD overnight, 12 hours, 5 exchanges, 2.5L fill volume, 4.25% and 2.5% dianeal solution alternating   -strict I/O, daily weight   -will send peritoneal fluid for analysis, gentamicin cream to  catheter site  -will need nystatin if start on IV abx     #Anemia of CKD  -transfuse to keep hgb > 7   -please check iron panel prior to transfusion if possible     #HTN  -unclear home medications  -on nitro gtt per CCU     #Metabolic acidosis   -Bicarb 20, will correct with PD     #ESKD-MBD  -phos 12 and C 6.9  -please recheck phos and ica, monitor daily   -start sevelamer 1.39m TID with meals     #Electrolyte disorder  - will correct with PD

## 2021-03-18 ENCOUNTER — Inpatient Hospital Stay (HOSPITAL_COMMUNITY): Payer: Medicare PPO

## 2021-03-18 ENCOUNTER — Encounter (HOSPITAL_COMMUNITY): Payer: Self-pay

## 2021-03-18 ENCOUNTER — Other Ambulatory Visit: Payer: Self-pay

## 2021-03-18 ENCOUNTER — Encounter (HOSPITAL_COMMUNITY): Payer: Self-pay | Admitting: Nurse Practitioner

## 2021-03-18 DIAGNOSIS — I82C13 Acute embolism and thrombosis of internal jugular vein, bilateral: Secondary | ICD-10-CM

## 2021-03-18 DIAGNOSIS — J9601 Acute respiratory failure with hypoxia: Secondary | ICD-10-CM

## 2021-03-18 DIAGNOSIS — N250 Renal osteodystrophy: Secondary | ICD-10-CM

## 2021-03-18 DIAGNOSIS — I120 Hypertensive chronic kidney disease with stage 5 chronic kidney disease or end stage renal disease: Secondary | ICD-10-CM

## 2021-03-18 DIAGNOSIS — I5020 Unspecified systolic (congestive) heart failure: Secondary | ICD-10-CM

## 2021-03-18 DIAGNOSIS — K922 Gastrointestinal hemorrhage, unspecified: Secondary | ICD-10-CM

## 2021-03-18 DIAGNOSIS — D649 Anemia, unspecified: Secondary | ICD-10-CM

## 2021-03-18 DIAGNOSIS — I513 Intracardiac thrombosis, not elsewhere classified: Secondary | ICD-10-CM

## 2021-03-18 DIAGNOSIS — I428 Other cardiomyopathies: Secondary | ICD-10-CM

## 2021-03-18 DIAGNOSIS — K920 Hematemesis: Secondary | ICD-10-CM

## 2021-03-18 DIAGNOSIS — I160 Hypertensive urgency: Secondary | ICD-10-CM

## 2021-03-18 DIAGNOSIS — I2510 Atherosclerotic heart disease of native coronary artery without angina pectoris: Secondary | ICD-10-CM

## 2021-03-18 DIAGNOSIS — I161 Hypertensive emergency: Secondary | ICD-10-CM

## 2021-03-18 DIAGNOSIS — R000 Tachycardia, unspecified: Secondary | ICD-10-CM

## 2021-03-18 DIAGNOSIS — D509 Iron deficiency anemia, unspecified: Secondary | ICD-10-CM

## 2021-03-18 DIAGNOSIS — I21A1 Myocardial infarction type 2: Secondary | ICD-10-CM

## 2021-03-18 LAB — CBC (HEMOGRAM)
Hematocrit: 22 % — ABNORMAL LOW (ref 38.0–50.0)
Hematocrit: 22 % — ABNORMAL LOW (ref 38.0–50.0)
Hematocrit: 23 % — ABNORMAL LOW (ref 38.0–50.0)
Hematocrit: 26 % — ABNORMAL LOW (ref 38.0–50.0)
Hemoglobin: 7.2 g/dL — ABNORMAL LOW (ref 13.0–18.0)
Hemoglobin: 7 g/dL — ABNORMAL LOW (ref 13.0–18.0)
Hemoglobin: 7.8 g/dL — ABNORMAL LOW (ref 13.0–18.0)
Hemoglobin: 8.3 g/dL — ABNORMAL LOW (ref 13.0–18.0)
MCH: 29.6 pg (ref 27.3–33.6)
MCH: 28.5 pg (ref 27.3–33.6)
MCH: 29.4 pg (ref 27.3–33.6)
MCH: 28.8 pg (ref 27.3–33.6)
MCHC: 33.2 g/dL (ref 32.2–36.5)
MCHC: 32 g/dL — ABNORMAL LOW (ref 32.2–36.5)
MCHC: 33.3 g/dL (ref 32.2–36.5)
MCHC: 31.9 g/dL — ABNORMAL LOW (ref 32.2–36.5)
MCV: 89 fL (ref 81–98)
MCV: 89 fL (ref 81–98)
MCV: 88 fL (ref 81–98)
MCV: 90 fL (ref 81–98)
Platelet Count: 149 10*3/uL — ABNORMAL LOW (ref 150–400)
Platelet Count: 142 10*3/uL — ABNORMAL LOW (ref 150–400)
Platelet Count: 172 10*3/uL (ref 150–400)
Platelet Count: 151 10*3/uL (ref 150–400)
RBC: 2.43 10*6/uL — ABNORMAL LOW (ref 4.40–5.60)
RBC: 2.46 10*6/uL — ABNORMAL LOW (ref 4.40–5.60)
RBC: 2.65 10*6/uL — ABNORMAL LOW (ref 4.40–5.60)
RBC: 2.88 10*6/uL — ABNORMAL LOW (ref 4.40–5.60)
RDW-CV: 14.6 % — ABNORMAL HIGH (ref 11.6–14.4)
RDW-CV: 14.7 % — ABNORMAL HIGH (ref 11.6–14.4)
RDW-CV: 15 % — ABNORMAL HIGH (ref 11.6–14.4)
RDW-CV: 15 % — ABNORMAL HIGH (ref 11.6–14.4)
WBC: 4 10*3/uL — ABNORMAL LOW (ref 4.3–10.0)
WBC: 3.7 10*3/uL — ABNORMAL LOW (ref 4.3–10.0)
WBC: 4.37 10*3/uL (ref 4.3–10.0)
WBC: 6.22 10*3/uL (ref 4.3–10.0)

## 2021-03-18 LAB — BLOOD GAS, ARTERIAL (NO ELECTROLYTES)
Base Deficit Blood, ART: 2.5 meq/L — ABNORMAL HIGH (ref 0.0–2.0)
Base Excess Blood, ART: 1.1 meq/L (ref 0.0–3.0)
Bicarbonate: 22 meq/L (ref 22–26)
Bicarbonate: 25 meq/L (ref 22–26)
O2 Saturation: 99 % (ref 95–99)
O2 Saturation: 99 % (ref 95–99)
pCO2, ART: 37 mmHg (ref 33–48)
pCO2, ART: 37 mmHg (ref 33–48)
pH, ART: 7.39 (ref 7.35–7.45)
pH, ART: 7.44 (ref 7.35–7.45)
pO2: 115 mmHg — ABNORMAL HIGH (ref 70–95)
pO2: 114 mmHg — ABNORMAL HIGH (ref 70–95)

## 2021-03-18 LAB — BASIC METABOLIC PANEL
Anion Gap: 23 — ABNORMAL HIGH (ref 4–12)
Calcium: 6.9 mg/dL — ABNORMAL LOW (ref 8.9–10.2)
Carbon Dioxide, Total: 21 meq/L — ABNORMAL LOW (ref 22–32)
Chloride: 96 meq/L — ABNORMAL LOW (ref 98–108)
Creatinine: 16.95 mg/dL — ABNORMAL HIGH (ref 0.51–1.18)
Glucose: 78 mg/dL (ref 62–125)
Potassium: 4.9 meq/L (ref 3.6–5.2)
Sodium: 140 meq/L (ref 135–145)
Urea Nitrogen: 85 mg/dL — ABNORMAL HIGH (ref 8–21)
eGFR by CKD-EPI 2021: 3 mL/min/{1.73_m2} — ABNORMAL LOW (ref >59)

## 2021-03-18 LAB — COMPREHENSIVE METABOLIC PANEL
ALT (GPT): 7 U/L — ABNORMAL LOW (ref 10–48)
AST (GOT): 11 U/L (ref 9–38)
Albumin: 2.3 g/dL — ABNORMAL LOW (ref 3.5–5.2)
Alkaline Phosphatase (Total): 86 U/L (ref 39–139)
Anion Gap: 21 — ABNORMAL HIGH (ref 4–12)
Bilirubin (Total): 0.4 mg/dL (ref 0.2–1.3)
Calcium: 6.4 mg/dL — ABNORMAL LOW (ref 8.9–10.2)
Carbon Dioxide, Total: 22 meq/L (ref 22–32)
Chloride: 96 meq/L — ABNORMAL LOW (ref 98–108)
Creatinine: 18.93 mg/dL — ABNORMAL HIGH (ref 0.51–1.18)
Glucose: 152 mg/dL — ABNORMAL HIGH (ref 62–125)
Potassium: 4 meq/L (ref 3.6–5.2)
Protein (Total): 4.8 g/dL — ABNORMAL LOW (ref 6.0–8.2)
Sodium: 139 meq/L (ref 135–145)
Urea Nitrogen: 94 mg/dL — ABNORMAL HIGH (ref 8–21)
eGFR by CKD-EPI 2021: 3 mL/min/{1.73_m2} — ABNORMAL LOW (ref >59)

## 2021-03-18 LAB — EKG 12 LEAD
Atrial Rate: 70 {beats}/min
Diagnosis: NORMAL
P Axis: 81 degrees
P-R Interval: 154 ms
Q-T Interval: 484 ms
QRS Duration: 88 ms
QTC Calculation: 522 ms
R Axis: 71 degrees
T Axis: 50 degrees
Ventricular Rate: 70 {beats}/min

## 2021-03-18 LAB — LAB ADD ON ORDER

## 2021-03-18 LAB — CELL COUNT AND DIFF, FLUID
% Basophils, Fluid: 0 %
% Eosinophils, Fluid: 2 %
% Lymphocytes, Fluid: 63 %
% Macro/Meso cells, Fluid: 32 %
% Neutrophils, Fluid: 3 %
% Unclassified Cells, Fluid: 0 %
Body Fluid Nucleated Cells: 3 {cells}/uL (ref 0–5)
No. of Cells Counted for Diff: 100 {cells}
RBC, FLD: <3000 {cells}/uL

## 2021-03-18 LAB — DIALYSIS & PERI PNL,FLD
Calcium, FLD: 4.2 mg/dL
Carbon Dioxide, FLD: 15 meq/L
Chloride, FLD: 95 meq/L
Glucose, FLD: 1327 mg/dL
Potassium, FLD: 3 meq/L
Sodium, FLD: 129 meq/L

## 2021-03-18 LAB — MAGNESIUM: Magnesium: 1.7 mg/dL — ABNORMAL LOW (ref 1.8–2.4)

## 2021-03-18 LAB — POTASSIUM, WBLD: Potassium: 4.3 meq/L (ref 3.7–5.2)

## 2021-03-18 LAB — PROTHROMBIN TIME
Prothrombin INR: 1.2 (ref 0.8–1.3)
Prothrombin Time Patient: 14.8 s (ref 10.7–15.6)

## 2021-03-18 LAB — RETICULOCYTE COUNT
Absolute Reticulocyte Count: 37 10*9/L (ref 25–125)
RETIC HEMOGLOBIN EQUIVALENT: 34 pg (ref 28.0–38.0)
Reticulocyte Count, %: 1.6 % (ref 0.5–2.5)

## 2021-03-18 LAB — COVID-19 CORONAVIRUS QUALITATIVE PCR: COVID-19 Coronavirus Qual PCR Result: NOT DETECTED

## 2021-03-18 LAB — PARTIAL THROMBOPLASTIN TIME: Partial Thromboplastin Time: 36 s — ABNORMAL HIGH (ref 22–35)

## 2021-03-18 LAB — TRANSTHORACIC ECHO (TTE) LIMITED
EF: 54.7 %
IVSd: 1.3 cm
LV Systolic Volume (BP): 60.1 ml
LVIDd: 4.2 cm
LVIDs: 2.9 cm
LVPWd: 1.2 cm
RV Free wall pk S': 16.6 cm/s
RVDd: 3.9 cm
TR Peak Vel: 260 cm/s

## 2021-03-18 LAB — CALCIUM (IONIZED), WB
Calcium (Ionized): 0.86 mmol/L — ABNORMAL LOW (ref 1.18–1.38)
Calcium (Ionized): 0.9 mmol/L — ABNORMAL LOW (ref 1.18–1.38)

## 2021-03-18 LAB — TROPONIN_I
Troponin_I Interpretation: ELEVATED
Troponin_I Interpretation: ELEVATED
Troponin_I: 0.11 ng/mL — ABNORMAL HIGH (ref <0.04)
Troponin_I: 0.1 ng/mL — ABNORMAL HIGH (ref <0.04)

## 2021-03-18 LAB — IRON BINDING CAPACITY (W/IRON, TRANSFERRIN & TRANSF SAT)
Iron, SRM: 27 ug/dL — ABNORMAL LOW (ref 31–171)
Total Iron Binding Capacity: 174 ug/dL — ABNORMAL LOW (ref 250–460)
Transferrin Saturation: 16 % (ref 15–50)
Transferrin: 124 mg/dL — ABNORMAL LOW (ref 180–329)

## 2021-03-18 LAB — FERRITIN: Ferritin: 403 ng/mL — ABNORMAL HIGH (ref 20–230)

## 2021-03-18 LAB — PHOSPHATE
Phosphate: 11.8 mg/dL — ABNORMAL HIGH (ref 2.5–4.5)
Phosphate: 10.7 mg/dL — ABNORMAL HIGH (ref 2.5–4.5)

## 2021-03-18 LAB — HEMOGLOBIN A1C, HPLC: Hemoglobin A1C: 5.2 % (ref 4.0–6.0)

## 2021-03-18 MED ORDER — GENTAMICIN SULFATE 0.1 % EX CREA
1.0000 | TOPICAL_CREAM | CUTANEOUS | Status: DC | PRN
Start: 2021-03-18 — End: 2021-03-22

## 2021-03-18 MED ORDER — ATORVASTATIN CALCIUM 40 MG OR TABS
40.0000 mg | ORAL_TABLET | Freq: Every day | ORAL | Status: DC
Start: 2021-03-18 — End: 2021-03-22
  Administered 2021-03-18 – 2021-03-22 (×5): 40 mg via ORAL
  Filled 2021-03-18 (×5): qty 1

## 2021-03-18 MED ORDER — HYDRALAZINE HCL 10 MG OR TABS
20.0000 mg | ORAL_TABLET | Freq: Four times a day (QID) | ORAL | Status: DC
Start: 2021-03-18 — End: 2021-03-18
  Administered 2021-03-18: 20 mg via ORAL
  Filled 2021-03-18: qty 2

## 2021-03-18 MED ORDER — CALCIUM GLUCONATE-NACL 1-0.675 GM/50ML-% IV SOLN
1.0000 g | Freq: Once | INTRAVENOUS | Status: AC
Start: 2021-03-18 — End: 2021-03-18
  Administered 2021-03-18: 1 g via INTRAVENOUS
  Filled 2021-03-18: qty 50

## 2021-03-18 MED ORDER — HYDRALAZINE HCL 25 MG OR TABS
25.0000 mg | ORAL_TABLET | Freq: Once | ORAL | Status: AC
Start: 2021-03-18 — End: 2021-03-18
  Administered 2021-03-18: 25 mg via ORAL
  Filled 2021-03-18: qty 1

## 2021-03-18 MED ORDER — MAGNESIUM SULFATE 2 GM/50ML SWFI IV SOLN
2.0000 g | Freq: Once | INTRAVENOUS | Status: AC
Start: 2021-03-18 — End: 2021-03-18
  Administered 2021-03-18: 2 g via INTRAVENOUS
  Filled 2021-03-18: qty 50

## 2021-03-18 MED ORDER — HYDRALAZINE HCL 20 MG/ML IJ SOLN
25.0000 mg | Freq: Once | INTRAMUSCULAR | Status: DC
Start: 2021-03-18 — End: 2021-03-18

## 2021-03-18 MED ORDER — SODIUM CHLORIDE (PF) 0.9 % IJ SOLN
1.0000 mL | Freq: Once | INTRAVENOUS | Status: DC | PRN
Start: 2021-03-18 — End: 2021-03-18

## 2021-03-18 MED ORDER — HYDRALAZINE HCL 50 MG OR TABS
50.0000 mg | ORAL_TABLET | Freq: Four times a day (QID) | ORAL | Status: DC
Start: 2021-03-18 — End: 2021-03-18

## 2021-03-18 MED ORDER — ACETAMINOPHEN 325 MG OR TABS
650.0000 mg | ORAL_TABLET | Freq: Four times a day (QID) | ORAL | Status: DC | PRN
Start: 2021-03-18 — End: 2021-03-22
  Administered 2021-03-18 – 2021-03-22 (×5): 650 mg via ORAL
  Filled 2021-03-18 (×5): qty 2

## 2021-03-18 MED ORDER — HYDRALAZINE HCL 50 MG OR TABS
75.0000 mg | ORAL_TABLET | Freq: Four times a day (QID) | ORAL | Status: DC
Start: 2021-03-18 — End: 2021-03-21
  Administered 2021-03-18 – 2021-03-21 (×12): 75 mg via ORAL
  Filled 2021-03-18 (×12): qty 1

## 2021-03-18 MED ORDER — CALCIUM GLUCONATE-NACL 2-0.675 GM/100ML-% IV SOLN
2.0000 g | Freq: Once | INTRAVENOUS | Status: AC
Start: 2021-03-18 — End: 2021-03-18
  Administered 2021-03-18: 2 g via INTRAVENOUS
  Filled 2021-03-18: qty 100

## 2021-03-18 MED ORDER — THIAMINE HCL 100 MG/ML IJ SOLN
100.0000 mg | Freq: Every day | INTRAVENOUS | Status: DC
Start: 2021-03-18 — End: 2021-03-19
  Administered 2021-03-18: 100 mg via INTRAVENOUS
  Filled 2021-03-18 (×2): qty 1

## 2021-03-18 MED ORDER — HYDROMORPHONE HCL 2 MG OR TABS
1.0000 mg | ORAL_TABLET | Freq: Four times a day (QID) | ORAL | Status: DC | PRN
Start: 2021-03-18 — End: 2021-03-20
  Administered 2021-03-18 – 2021-03-20 (×4): 2 mg via ORAL
  Filled 2021-03-18 (×5): qty 1

## 2021-03-18 MED ORDER — CARVEDILOL 6.25 MG OR TABS
6.2500 mg | ORAL_TABLET | Freq: Two times a day (BID) | ORAL | Status: DC
Start: 2021-03-18 — End: 2021-03-18

## 2021-03-18 MED ORDER — CARVEDILOL 6.25 MG OR TABS
6.2500 mg | ORAL_TABLET | Freq: Once | ORAL | Status: AC
Start: 2021-03-18 — End: 2021-03-18
  Administered 2021-03-18: 6.25 mg via ORAL
  Filled 2021-03-18: qty 1

## 2021-03-18 MED ORDER — OXYCODONE HCL 5 MG OR TABS
2.5000 mg | ORAL_TABLET | Freq: Four times a day (QID) | ORAL | Status: DC | PRN
Start: 2021-03-18 — End: 2021-03-18

## 2021-03-18 MED ORDER — CARVEDILOL 6.25 MG OR TABS
6.2500 mg | ORAL_TABLET | Freq: Two times a day (BID) | ORAL | Status: DC
Start: 2021-03-19 — End: 2021-03-22
  Administered 2021-03-19 – 2021-03-22 (×7): 6.25 mg via ORAL
  Filled 2021-03-18 (×7): qty 1

## 2021-03-18 NOTE — Progress Notes (Addendum)
Progress Note     Casey Wong") - DOB: 1966-03-20 (55 year old male)  Admit Date: 03/17/2021  Code Status: Full Code       CHIEF CONCERN / IDENTIFICATION:  Casey Wong is a 55 year old man with a history of NICM, HFmrEF, CAD, ESRD on PD, admitted with hypertensive urgency post intubation in the field, whom I am asked to see for dialysis management.      SUBJECTIVE   INTERVAL HISTORY:  Casey Wong is intubated/sedated and unable to provide history    Admitted to the CCU and initiated on a nitroglycerin gtt + oral hydralazine  BP improved to the 140s/70s (MAP 93)  Started on 12h PD  Potassium improved to 4 on labs from 0200    SCHEDULED MEDICATIONS:     atorvastatin, 40 mg, Daily    calcium gluconate, 2 g, Once    chlorhexidine gluconate, 15 mL, BID    hydrALAZINE, 75 mg, q6h [facility]    pantoprazole, 40 mg, q12h    sevelamer carbonate, 1,600 mg, TID with meals    thiamine, 100 mg, Daily    INFUSED MEDICATIONS:  nitroGLYCERIN, 0-4 mcg/kg/min (Dosing Weight), Continuous, Last Rate: 1.5 mcg/kg/min (03/18/21 0800)    propofol, 0-80 mcg/kg/min (Dosing Weight), Continuous, Last Rate: 35 mcg/kg/min (03/18/21 0930)    sodium chloride, 10 mL/hr, Continuous, Last Rate: 10 mL/hr (03/18/21 0800)     PRN MEDICATIONS:  calcium carbonate, 1,000 mg, TID PRN    fentaNYL PF, 12.5-25 mcg, q30 min PRN    fentaNYL PF, 12.5-25 mcg, q5 min PRN    gentamicin, 1 application, PRN    ondansetron, 4 mg, q8h PRN    ondansetron, 4 mg, q8h PRN    perflutren lipid microsphere (Definity) in NS injection, 1-10 mL, Once PRN    propofol, 10-50 mg, PRN    senna, 17.2 mg, BID PRN       OBJECTIVE     Vitals (Most recent in last 24 hrs)     T: 36.1 C (03/18/21 0800)  BP: 109/65 (03/17/21 2000)  HR: 76 (03/18/21 0800)  RR: 20 (03/18/21 0800)  SpO2: 96 % (03/18/21 0800) Ventilator   30 %  T range: Temp  Min: 35.8 C  Max: 36.1 C  Admit weight: 69 kg (152 lb 1.9 oz) (03/17/21 2050)  Last weight: 69.5 kg (153 lb 3.5 oz)  (03/18/21 0210)       I&Os:     Intake/Output Summary (Last 24 hours) at 03/18/2021 0931  Last data filed at 03/18/2021 0800  Intake 1018.67 ml   Output 600 ml   Net 418.67 ml       Physical Exam  Gen: acutely ill, intubated/sedated, no acute distress  HEENT: anicteric conjunctivae, ETT in place  CV: RRR, no audible murmur, distended neck veins  Pulm: clear ventilated breath sounds throughout  Abd: distended, no grimace with palpation  Ext: no edema  Skin: warm  Neuro: sedated  Access: PD catheter with slight erythematous nodule at the exit site, no discharge    Labs (last 24 hours):   Chemistries  CBC  LFT  Gases, other   139 96 94 152   7.0   AST: 11 ALT: 7  7.39/-/-/22   -/-/-/-   4.0 22 18.93   3.70 >< 142  AP: 86 T bili: 0.4  Lact (a): - Lact (v): -   eGFR: 3 Ca: 6.4   22   Prot: 4.8 Alb: 2.3  Trop I:  0.11 D-dimer: -   Mg: 1.9 PO4: 11.8  ANC: 4.14     BNP: 2,381 Anti-Xa: -     ALC: 0.64    INR: 1.2        IMAGING:   I have reviewed the latest radiology results           ASSESSMENT/PLAN      Casey Wong is a 55 year old man with a history of NICM, HFmrEF, CAD, ESRD on PD, admitted with hypertensive urgency post intubation in the field, whom I am asked to see for dialysis management.     1. 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 Poor compliance with care - appears he initiated PD in April of this year though based on labs on admission question compliance with PD. No clear outpatient Rx - previously 4 exchanges at 2L FVs, combination 2.5 and 4.25% dextrose, no LF + MDE with 4.25%. Unclear if he has residual kidney function - no UOP charted since arrival. No e/o peritonitis on cell count. Clearance has improved since arrival. 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 I reviewed his interval chest radiograph which is improved, though not clear if that's  from PPV/nitro/UF or combination. No clear prior TW. My suggestion would be to extend PD for an additional 12 hours to challenge volume removal in an effort to discontinue his nitro gtt.     -Extend PD an additional 12 hours - 5 exchanges over 12 hours (given volume is primary concern) with combination 4.25 and 2.5% dextrose, add ico last fill  -gentamicin cream to exit site daily  -If starting systemic abx would need nystatin swish and swallow    2. Hypertension: Will address volume component as above. Given compliance issues transitioning to primarily daily medications/patches would be most useful. We can be of assistance in this regard prn prior to d/c.     3. Anemia: Will benefit from IV iron and Epo. Will hold the latter until extreme hypertensive issues resolved. Concern for acute bleeding given coffee ground emesis and GI to be consulted. Would transfuse prn for goal Hb>7.    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 Can give calcium to maintain iCa >1.     Please page with questions. I discussed the above with the primary service

## 2021-03-18 NOTE — Consults (Signed)
CONSULTATION:  Consult requested by Christiana Pellant for advice/opinion regarding CGE.     HOSPITAL DAY: Hospital Day: 2    CHIEF CONCERN: (Required for all billing levels)  CGE    INTERVAL HISTORY/MAJOR EVENTS: (past 24 hours)  Casey Wong with PMH of PMH NICM (LVIDd 4.6, EF40-45%), non-occlusive CAD, h/o DVT, ESRD on PD with multiple ED visits and admissions for shortness of breath that appears to be secondary to volume overload due to non-compliance who is being admitted for AHRF, intubated on admission. GI is consulted for coffee ground output via NGT.    Patient unable to provide history due to intubated/sedated status, history obtained per primary and EMR. Patient presented 03/17/21 to [facility] reporting dyspnea and was intubated for AHRF. There was reportedly blood (or CG) in OG tube and this was tested as guaiac positive (unclear why guaiac was sent if visible blood was seen). He was t/w protonix there. He was noted to be hypertensive with SBP>>200 and d/2 concern for hypertensive emergency he was started on nitroglycerin gtt before being transferred overnight to [facility] for higher level of care.     Of note, no endoscopy in our chart or care everywhere. Per primary team, he has had issues with CGE during prior admissions (unable to find these records).    PROBLEM LIST:   Active Hospital Problems    *Acute decompensated heart failure (HCC)        ALLERGIES:   Ceftaroline, Ceftazidime, Codeine, Lisinopril, and Penicillins  Patient unable to communicate and provide ROS or details of past medical, social, or family history    SCHEDULED MEDICATIONS:  atorvastatin, 40 mg, Daily  calcium gluconate, 2 g, Once  chlorhexidine gluconate, 15 mL, BID  hydrALAZINE, 75 mg, q6h [place]  pantoprazole, 40 mg, q12h  sevelamer carbonate, 1,600 mg, TID with meals  thiamine, 100 mg, Daily        INFUSIONS:  nitroGLYCERIN, Last Rate: 1.5 mcg/kg/min (03/18/21 0800)  propofol, Last Rate: 45 mcg/kg/min (03/18/21  0800)  sodium chloride, Last Rate: 10 mL/hr (03/18/21 0800)      REVIEW OF SYSTEMS:   Review of Systems   Unable to obtain due to AMS    Temp:  [35.8 C-36.1 C] 36.1 C  Pulse:  [58-76] 76  BP: (109)/(65) 109/65  Weight:  [69 kg (152 lb 1.9 oz)-69.5 kg (153 lb 3.5 oz)] 69.5 kg (153 lb 3.5 oz)    I&O DATA:   Height: Ht 5' 10.866" (1.8 m)  Weight: Wt 153 lb 3.5 oz (69.5 kg)  BMI: Body mass index is 21.45 kg/m.  Date 03/18/21 0700 - 03/19/21 0659   Shift 0700-1459 1500-2259 2300-0659 24 Hour Total   INTAKE   P.O. 0   0   I.V. 164.1   164.1   Shift Total 164.1   164.1   OUTPUT   Emesis/NG output 100   100   Shift Total 100   100   NET 64.1   64.1   Weight (kg) 69.5 69.5 69.5 69.5     Physical Exam  Constitutional:       Appearance: He is ill-appearing.   HENT:      Head: Atraumatic.   Pulmonary:      Comments: intubated  Abdominal:      Palpations: Abdomen is soft.   Neurological:      Comments: Sedated        LABORATORY STUDIES: (Most recent results in 24 hour range.)    CBC:  08/15 0452 3.70 \ 7.0 / 142  BMP:    139 96 94 / 152     / 22 \    4.0 22 18.93 \      MICRO:   I have personally reviewed the latest Micro results and cultures    IMAGING:   I have reviewed the latest radiology results    ASSESSMENT / PLAN:   Casey Wong with PMH of PMH NICM (LVIDd 4.6, EF40-45%), non-occlusive CAD, h/o DVT, ESRD on PD with multiple ED visits and admissions for shortness of breath that appears to be secondary to volume overload due to non-compliance who is being admitted for AHRF, intubated on admission. GI is consulted for suspected coffee ground output via NGT.    HDS, critically ill intubated and on nitroglycerin gtt for hypertensive urgency/emergency.    Hb 6.6 on admission (Hb at OSH over last 1-2 weeks has been ranging 7.5-10 however unusually rapid shifts suspect 2/2 fluid shifts; true BL ?appears to be ~7-9 over recent months), s/p 1U ->7.2, inadequate response. Stable on repeat at 7.0. Not on antiplatelet  or anticoagulation.    Of note, no utility in gastrooccult test in this clinical setting and GI does not recommend sending this test. Very many things can cause false positive, and regardless even if presume true positive it does not change management.     On my exam, ~600cc of bilious fluid in cannister, possible small flakes of darker material. NGT clamped but in line is also bilious fluid. Nurse at bedside, reports this is consistent with what has been outputting, notes occasionally she can see small flecks of red or darker possible CG.     Etiology of possible CGE is broad, in this clinical setting would consider NGT trauma, critical illness gastropathy, gastritis, esophagitis, MWT, PUD, other. All highest-suspicion etiologies can be treated with PPI therapy. Given overall clincal instability and picture, would likely reserve endoscopy for significant, active bleed.    RECOMMENDATIONS:  -Low suspicion active, clinically significant GIB at this time  -Agree with IV PPI BID, would continue while intubated and transition to PO high-dose BID PPI when extubated and taking regular orals  -Trend H/H and transfuse PRN Hb<8 (would give another unit)  -NSAIDs contraindicated  -Ensure NGT to intermittent suction to prevent further NGT-related trauma  -Pending clinical course, consider inpt vs opt endoscopy for reportedly chronic intermittent CGE    Thank you for the opportunity to help care for this patient. Please page with questions or concerns.    SUA Dr. Adrian Blackwater    Sharon Seller MD PGY-5  GI/Hepatology Fellow

## 2021-03-18 NOTE — Progress Notes (Signed)
Cardiology Critical Care Attending Note    Critical care is necessary because the patient requires continuous physician observation and interventions in an ICU setting to respond to emergent changes in their medical condition and to prevent further deterioration in their clinical state.    I personally examined the patient today and reviewed the patient's clinical course, laboratory data, and:  '[]'$  Radiological reports and images  '[]'$  Catheterization images  '[x]'$  Echocardiographic images  '[]'$  Ventilator parameters  '[]'$  Hemodynamic data  '[]'$  ICD interrogation  '[]'$  VAD/MCS interrogation    PERTINENT HISTORY, EXAM AND DATA:  Casey Wong is a 55 year old male with hx of NICM with HFmrEF 40-45%(4/22), non-obstructive CAD, ESRD on PD, HTN, thrombus bilateral IJ, R subclavian thrombus and previous DVT (on apixaban) transferred from OSH after developing acute hypertension likley 2/2 to volume overload c/b  hypoxemic respiratory failure requiring intubation in the field.     #Acute hypoxemic Respiratory Failure  Etiology likely 2/2 to volume overload and HTN emergency with pulmonary edema. Admission CXR with bilateral pleural effusion and pulmonary edema, subsequently improved.   - passed SAT/SBT--> extubated 8/15  - Continue HTN management as below    #HTN emergency  Presented to OSH with SBP in the 200s, with associated troponin elevation and resp failure/ pulm edema.  Pt with prior hx of difficult to control HTN I/s/o volume overload during most recent prior hospitalizations.   Plan:  - MAP goal 65-80  - wean Nitroglycerin gtt  - transition hydralazine to (longer acting) ACEi/ARB when hemodynamically stable  - start coreg 3.125 mg BID once hemodynamics and volume status improved    #ESRD on PD  #Hyperphosphatemia  #Hypocalcemia  Unclear etiology of ESRD. Previously on HD but d/c due to access failures secondary to recurrent thrombotic events.  Poor compliance on PD in April 2022 with multiple admissions for volume  related I/s/o missed PD sessions and PD catheter issues. Unclear residual kidney function.  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 nonobstructive CAD.  F/u TTE repeat read  - Hold ASA '81mg'$    - continue atorvastatin '40mg'$  daily     #NSTEMI type II   - Trend troponin until peak    #thrombus bilateral IJ  # R subclavian thrombus  # hx of DVT   Holding apixaban due to c/f GI bleed. Pending GI recs and H/H trend can consider restarting during hospitalization.  - Hold apixaban   - SCD for now    The following conditions and interventions require complex, high-level decision-making and contribute to the high probability of acute, clinically significant deterioration:    DIAGNOSES:   '[]'$  close observation and management of cardiogenic shock and/or post-procedural management with the following:   '[]'$  Inotropes/pressors   '[]'$  Intra-aortic balloon pump   '[]'$  Ventricular assist device   '[]'$  Intravascular volume resuscitation   '[]'$  Invasive hemodynamic monitoring  '[]'$  Close observation and management of patient with, or at high risk for recurrence of, hemodynamically-significant arrhythmias  '[x]'$  Close observation and management of acute respiratory failure with the following:   '[]'$  CPAP/BIPAP  or HFNC   '[x]'$  Mechanical ventilation  '[]'$  Management of acute kidney injury or acute renal failure with:  '[]'$  Close observation and management of volume status and hemodynamics  '[]'$  Renal replacement therapy  '[]'$  Close observation and management of an acute neurological condition  '[]'$  Intravascular volume resuscitation for shock as documented below  '[]'$  Vasopressors for shock as documented below  '[x]'$  Other:  hypertensive emergency with iv antihypertensives    ASSESSMENT AND PLAN:   I spent a total of 50 minutes personally providing critical care and formulating a plan for the day, independent of any time spent teaching or performing any separately billable procedures.     I performed the majority of the Medical Decision Making for this patient on this date. The above statements represent my decisions unless otherwise indicated.    '[]'$  This critical care time includes meeting with the patient's ___ for the sole purpose of data gathering, discussion of treatment options, and care planning (patient incapable of participating in medical decision making).    DATE OF SERVICE: 03/18/2021    Judd Gaudier, MD

## 2021-03-18 NOTE — Nursing Note (Signed)
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 for respiratory failure    8/14 Admit to CCU. 1 U PRBC for low H/H. 12 hr PD run started. TLC and art line placed. Attempted SAT, but became HTN (MAP 120's) no SBT attempted.  Edited by: Tiana Loft, RN at 03/18/2021 [phone]    Illness Severity  Watcher  Edited by: Tiana Loft, RN at 03/18/2021 [phone]

## 2021-03-18 NOTE — Progress Notes (Signed)
03/18/21 0556   Vent Information   Vent ID [id number]   O2 Delivery Method Ventilator   Vent Mode VC/AC   Ventilator On Yes   $Vent Charge Subsequent Yes   Vent System Check Yes   Settings   FiO2 (%) 30 %   Resp Rate (Set) 18   Vt (Set, mL) 600 mL   PEEP/CPAP (cm H2O) 5 cm H20   Insp Time (sec) 0.75 sec   I:E Ratio 1:3.0   Insp Flow (L/min) 69 L/min   Waveform Decelerating ramp   Trigger Sensitivity Flow (L/min) 3 L/min   Humidification Heat and moisture exchanger   Rise Time 0.15 cmH2O   Readings   Resp Rate Observed 18   Vt (observed, mL) 6.5 mL   PIP Observed (cm H2O) 21 cm H2O   Plateau Pressure (cm H2O) 14 cm H2O   Minute Ventilation (L/min) 11 L/min   MAP (cm H2O) 8   Alarms   Insp Pressure High (cm H2O) 60 cm H2O   MV High (L/min) 20 L/min   MV Low (L/min) 5 L/min   Daily Screen   Daily Screen Complete Y   FiO2</=50% Yes   PEEP </=8cmH2O Yes   Ve </= 15 L/min Yes   RR </= 35 breaths/min Yes   Hemodynamically stable Yes   ICP </= 20 Yes   pH >/= 7.25 Yes   Daily Screen Meets All Criteria Yes   Spontaneous Breathing Trial Initiated No   Why no spontaneous breathing trial? Other (comment)  (did not pass SAT)   Weaning Parameters   Resp 18   IHI Ventilator Associated Pneumonia Bundle   Head of Bed Elevated HOB 30   ETT 03/17/21 Oral   Placement Date: 03/17/21   Placed by External Staff?: Other hospital  Single Lumen Tube Size: 8 mm  Location: Oral   Secured at (cm) 26 cm   Measured from Teeth/Gums   [place] by Charity fundraiser  ([employer] dated 8/14)   Site Condition Cool;Dry

## 2021-03-18 NOTE — Progress Notes (Signed)
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      SUBJECTIVE   INTERVAL HISTORY:  - Transferred from OSH for management of AHRF and urgent PD  - Arrived intubated with PEEP of 16/ FiO2 30%. Weaned to PEEP 10-->5/ FiO2 30% overnight  - Nephrology consulted and patient started on 12-hour PD run. No UOP since admission   - Continued to remain hypertensive with SBP in the 200s and MAPs >80 requiring uptitration of nitroglycerin and addition of hydralazine  - Unable to tolerate SAT due to significant hypertension  - Received 1unit of pRBC for hbg<7 with repeat 7.2/22(6.5/21)  - NGT with 600cc output of maroon colored  - Troponin downtrending   - iCA <1, K stable <5.   - PD fluid unremarkable   - A line and Left femoral CVC triple lumen       SCHEDULED MEDICATIONS:     atorvastatin, 40 mg, Daily    chlorhexidine gluconate, 15 mL, BID    hydrALAZINE, 75 mg, q6h SCH    pantoprazole, 40 mg, q12h    sevelamer carbonate, 1,600 mg, TID with meals    thiamine, 100 mg, Daily    INFUSED MEDICATIONS:  nitroGLYCERIN, 0-4 mcg/kg/min (Dosing Weight), Continuous, Last Rate: 1.2 mcg/kg/min (03/18/21 1215)    propofol, 0-80 mcg/kg/min (Dosing Weight), Continuous, Last Rate: Stopped (03/18/21 1201)    sodium chloride, 10 mL/hr, Continuous, Last Rate: 10 mL/hr (03/18/21 1200)     PRN MEDICATIONS:  calcium carbonate, 1,000 mg, TID PRN    fentaNYL PF, 12.5-25 mcg, q30 min PRN    fentaNYL PF, 12.5-25 mcg, q5 min PRN    gentamicin, 1 application, PRN    gentamicin, 1  application, PRN    ondansetron, 4 mg, q8h PRN    ondansetron, 4 mg, q8h PRN    perflutren lipid microsphere (Definity) in NS injection, 1-10 mL, Once PRN    propofol, 10-50 mg, PRN    senna, 17.2 mg, BID PRN       OBJECTIVE     Vitals (Most recent in last 24 hrs)     T: 36.3 C (03/18/21 1200)  BP: 109/65 (03/17/21 2000)  HR: 91 (03/18/21 1230)  RR: 20 (03/18/21 1200)  SpO2: 97 % (03/18/21 1230) Ventilator   30 %  T range: Temp  Min: 35.8 C  Max: 36.3 C  Admit weight: 69 kg (152 lb 1.9 oz) (03/17/21 2050)  Last weight: 69.5 kg (153 lb 3.5 oz) (03/18/21 0210)       I&Os:     Intake/Output Summary (Last 24 hours) at 03/18/2021 1251  Last data filed at 03/18/2021 1200  Intake 1382.51 ml   Output 750 ml   Net 632.51 ml       Physical Exam  GEN: appears stated age, lying in bed, sedated and vented in NAD  HEENT: PERRL, EOMI, sclera anicteric  CV: RRR, s1 & s2 +, with no R/M/G. Elevated JVD (though known thrombus of RIJ) No clubbing, cyanosis, or edema, 2+ pulses bilaterally  RESP: mechanical breath sounds throughout. No crackles, wheezes, or rhonchi   GI: Soft NT/ND, BS+, no HSM  MSK: Normal muscle bulk and tone  SKIN: No rashes on visible skin  NEURO: sedated, CN II-XII grossly intact,moving lower extremities spontaneously  PSYCH:unable to assess    Labs (last 24 hours):   Chemistries  CBC  LFT  Gases, other   139 96 94 152   7.8   AST: 11 ALT: 7  7.39/-/-/22   -/-/-/-   4.0 22 18.93   4.37 >< 172  AP: 86 T bili: 0.4  Lact (a): - Lact (v): -   eGFR: 3 Ca: 6.4   23   Prot: 4.8 Alb: 2.3  Trop I: 0.10 D-dimer: -   Mg: 1.9 PO4: 11.8  ANC: 4.14     BNP: 2,381 Anti-Xa: -     ALC: 0.64    INR: 1.2        IMAGING:   I have reviewed the latest radiology results  TransTHORACIC echo (TTE) limited   Final Result by Carolyne Littles, MD (08/15 1244)      XR Chest 1 View   Final Result by Alvera Novel, MD (08/15 0930)   Compared to 1258, lung volume has increased. Basal consolidation has decreased, but left  lower lobe atelectasis or aspiration persists.      Heart size is normal and unchanged.      An enteric tube has been placed with side-port in the body of the stomach. Endotracheal tube position is unchanged.      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       #Acute hypoxemic Respiratory Failure  Intubated in field after calling 911 for SOB. Etiology likely 2/2 to volume overload and HTN urgency with some c/f flash pulmonary edema. CXR on admission prior to initiation of PD with bilateral pleural effusion and pulmonary edema. Repeat CXR subsequently with improved lung volumes. CXR without any significant consolidation. BNP elevated at 2300 on admission (unclear baseline BNP). At OSH NT-proBNP >70K. BNP 4/15 4654. Wt on discharge on 7/9 at OSH noted to being 71.8kg (unknown dry wt)  Plan:  - daily SAT/SBT--> extubate if no plans for EGD  - Continue PD for volume management as below  - Continue HTN management as below  - Limited TTE  - f/u BCx of PD fluid  - IV Thiamine daily       #HTN urgency/emergency  Presented to OSH with SBP in the 200s requiring initiation of nitroglycerin gtt. Pt with prior hx of difficult to control HTN I/s/o volume overload during most recent prior hospitalizations. Most recent hospitalization in 6/22 patient was discharged on hydralazine 133m TID, metoprolol succinate 1064mq24hr and Imdur 2036mID. During this hospitalization pt was also started on nifedipine and minoxidil but was not continued on this on discharge. Unclear if patient was adherent to medication and PD regimen.  On 8/15:Pt with uptrending MAPS >90 overnight requiring initiation of hydralazine. Unable to pass SAT  given HTN. Ongoing HTN likely 2/2 to volume overload. Will manage volume status and continue to uptitrate antihypertensive and wean off nitroglycerin gtt.  Plan:  - Continue wean Nitroglycerin gtt  - MAP goal 65-80  - S/p hydralazine 49m hydralazine  - Continue hydralazine 767mq6hrs  - Volume management as below  - Pharm: med rec  - Consider restarting home imdur 2044mID   - Pending TTE consider coreg 6.125 mg BID     #ESRD on PD  #Hyperphosphatemia  #Hypocalcemia  Unclear etiology. Previously on HD but d/c due to access failures secondary to recurrent thrombotic events.  Poor compliance with care with patient appears to be initiated on PD in April 2022.  Has had multiple admissions for volume related I/s/o missed PD sessions and PD catheter issues. Unclear residual kidney function. No UOP since admission here. PD fluid analysis with no e/o peritonitis on cell count. Improvement in CXR since initiation of continuous PD.  Nephrology continue PD for additional 12 hours to challenge volume of in an effort to discontinue his nitro gtt.   Plan:  - BID BMP, Mg, PO4, iCa  - Continue PD- extend for an additional 12hrs  - continue Sevelamer daily once eating  - Okay to give Ca to maintain iCa >1  - S/p Ca gluconate 2 g x1      #Acute on chronic anemia  #Coffee ground emesis/melena   #Anemia of CKD   Pt with hx of chronic anemia of CKD. Iron panel c/w anemia of chronic disease. H/H on admission 6.6/21 (baseline 7-8/22) requiring 1unit of pRBC with repeat H/H 7.2/22. Repeat on 8/15 Am with H/H 7.0/22. NGT with coffee ground GI output (~600cc outpt since admission). GI consulted to evaluate for possible EGD. Pt with hx of prior presentations with coffee ground emesis/melena with plans for workup in the outpatient session. Per GI ddx broad including NGT trauma, critical illness gastropathy, gastritis, PUD, among others all of  which could be treated with PPI at this time. Per nephrology will eventually need EPO once stable from HTN standpoint   Plan:  - s/p 1unit of pRBC on 8/15  - NPO  - Daily CBC Epo once stable from HTN standpoint   - Nephrology following, appreciate recs   - Will eventually need   - GI consult, appreciate recs   - Low c/f significant GIB a this time.    - continue IV PPI BID while intubated--> PO high-dose BID PPI   - Trend H/H and transfuse PRN hb <8   - NGT to intermittent suction to prevent further NGT-related trauma   - pending clinical course inpt vs outpt endoscopy  - continue IV protonix 51m34mD  - f/u repeat CBC at noon  - transfuse for goal of Hb>7    #HFmrEF (40-45%)  #NICM-unclear etiology   #non-obstructive CAD  Unclear etiology of NICM. Had admission in April at OSH after presenting with acute HF exacerbation and hypervolemia. TTE with mild reduction in EF from 55% to 40-45%. RHC/LHC with nonobstructive CAD. Bedside POCUS per admitting provider with similar EF as noted on prior TTEs with no significant WMA. IVC dilated and non-collapsible but difficult to assess given vented.   Plan:  - Hold ASA 81mg77m continue atorvastatin 51mg 70my   - Limited TTE    #NSTEMI type II   Noted to having elevated troponin on admission here at 0.11 (at OSH 0.165-->0.118). Baseline 0.04-0.2. EKG with no significant ST changes but noted  to having prolonged QTc at 522. Most likely 2/2 to demand ischemia.   Plan:  - Trend troponin until peak  - Avoid QTc prolonging medications      #thrombus bilateral IJ  # R subclavian thrombus  # hx of DVT   Holding apixaban due to c/f GI bleed. Pending GI recs and H/H trend can consider restarting during hospitalization.  Plan:  - Hold apixaban   - SCD  - Daily CBC        ICU Checklist:    COVID-19 surveillance: negative 8/15  Delirium prevention/therapy: ICU delirium  Fluids/electrolytes:none  Diet: NPO diet NPO except for medications  Minimum mobility goal: in bed  DVT Prophylaxis:  SCD  GI Prophylaxis: IV PPI BID  Glucose control: <180  Lines/Drains/Airways: ET, L femoral CVC, PD port  Labs: BID BMP  Spine Precautions: none  Antibiotic stewardship: none  Disposition: CCU  Code Status: Full Code    Contacts: Primary Emergency ContactLoreen Freud, Home Phone: [phone]  Next of Kin: STUBER,MICHAEL

## 2021-03-18 NOTE — Nursing Note (Addendum)
Patient Summary  37 YOM w/ NICM (EF 55%), non-occlusive CAD, ESRD on PD (multiple visits for complications of non-compliance),  bilateral IJ thrombus, R subclavian thrombus and previous DVT (on apixaban) transferred to [facility] for respiratory failure, FVO, and hypertensive emergency     8/14 Admit to CCU already intubated. 1 U PRBC. 12 hr PD run. NTG gtt  8/15 ECHO completed, GI consulted, no procedure needed at this time, extubated to RA. HR 120-130s for few hours (Atach?), started coreg. DC NTG gtt  Edited by: Darden Dates, RN at 03/18/2021 1728    Illness Severity  Watcher  Edited by: Tiana Loft, RN at 03/18/2021 [phone]    Received PO dilaudid for headache with good results. Tolerating full liquid diet with plans to advance tomorrow if stable CBC. Soft light brown BMx1. Up to [facility] with assistance of 1. Able to make needs known. Dialysis RN aware of plans for PD tonight.

## 2021-03-19 ENCOUNTER — Encounter (HOSPITAL_COMMUNITY): Payer: Self-pay

## 2021-03-19 DIAGNOSIS — I132 Hypertensive heart and chronic kidney disease with heart failure and with stage 5 chronic kidney disease, or end stage renal disease: Principal | ICD-10-CM

## 2021-03-19 DIAGNOSIS — J810 Acute pulmonary edema: Secondary | ICD-10-CM

## 2021-03-19 DIAGNOSIS — I509 Heart failure, unspecified: Secondary | ICD-10-CM

## 2021-03-19 LAB — TYPE AND SCREEN
ABO/Rh: A POS
Antibody Screen: NEGATIVE
Expiration Date of Blood Product: 202208302359
ISBT Product Blood type: 6200
Product Issue Date/Time: 202208142209
Unit Division: 0
Unit Type and Rh: A POS
Units Ordered: 1

## 2021-03-19 LAB — COMPREHENSIVE METABOLIC PANEL
ALT (GPT): 7 U/L — ABNORMAL LOW (ref 10–48)
AST (GOT): 10 U/L (ref 9–38)
Albumin: 2.7 g/dL — ABNORMAL LOW (ref 3.5–5.2)
Alkaline Phosphatase (Total): 97 U/L (ref 39–139)
Anion Gap: 22 — ABNORMAL HIGH (ref 4–12)
Bilirubin (Total): 0.3 mg/dL (ref 0.2–1.3)
Calcium: 7.3 mg/dL — ABNORMAL LOW (ref 8.9–10.2)
Carbon Dioxide, Total: 22 meq/L (ref 22–32)
Chloride: 95 meq/L — ABNORMAL LOW (ref 98–108)
Creatinine: 14.83 mg/dL — ABNORMAL HIGH (ref 0.51–1.18)
Glucose: 119 mg/dL (ref 62–125)
Potassium: 3.9 meq/L (ref 3.6–5.2)
Protein (Total): 5.7 g/dL — ABNORMAL LOW (ref 6.0–8.2)
Sodium: 139 meq/L (ref 135–145)
Urea Nitrogen: 77 mg/dL — ABNORMAL HIGH (ref 8–21)
eGFR by CKD-EPI 2021: 4 mL/min/{1.73_m2} — ABNORMAL LOW (ref >59)

## 2021-03-19 LAB — COVID-19 CORONAVIRUS QUALITATIVE PCR: COVID-19 Coronavirus Qual PCR Result: NOT DETECTED

## 2021-03-19 LAB — CBC (HEMOGRAM)
Hematocrit: 25 % — ABNORMAL LOW (ref 38.0–50.0)
Hemoglobin: 8.3 g/dL — ABNORMAL LOW (ref 13.0–18.0)
MCH: 29 pg (ref 27.3–33.6)
MCHC: 32.9 g/dL (ref 32.2–36.5)
MCV: 88 fL (ref 81–98)
Platelet Count: 175 10*3/uL (ref 150–400)
RBC: 2.86 10*6/uL — ABNORMAL LOW (ref 4.40–5.60)
RDW-CV: 15 % — ABNORMAL HIGH (ref 11.6–14.4)
WBC: 5.33 10*3/uL (ref 4.3–10.0)

## 2021-03-19 LAB — EKG 12 LEAD
Atrial Rate: 123 {beats}/min
P Axis: 106 degrees
P-R Interval: 116 ms
Q-T Interval: 344 ms
QRS Duration: 86 ms
QTC Calculation: 492 ms
R Axis: 80 degrees
T Axis: 252 degrees
Ventricular Rate: 123 {beats}/min

## 2021-03-19 LAB — GLUCOSE POC, ~~LOC~~: Glucose (POC): 81 mg/dL (ref 62–125)

## 2021-03-19 LAB — MAGNESIUM: Magnesium: 2 mg/dL (ref 1.8–2.4)

## 2021-03-19 LAB — LAB ADD ON ORDER

## 2021-03-19 LAB — PROTHROMBIN TIME
Prothrombin INR: 1.1 (ref 0.8–1.3)
Prothrombin Time Patient: 14.4 s (ref 10.7–15.6)

## 2021-03-19 LAB — PARTIAL THROMBOPLASTIN TIME: Partial Thromboplastin Time: 35 s (ref 22–35)

## 2021-03-19 LAB — CALCIUM (IONIZED), WB: Calcium (Ionized): 0.92 mmol/L — ABNORMAL LOW (ref 1.18–1.38)

## 2021-03-19 LAB — PHOSPHATE: Phosphate: 9.4 mg/dL — ABNORMAL HIGH (ref 2.5–4.5)

## 2021-03-19 MED ORDER — PANTOPRAZOLE SODIUM 40 MG OR TBEC
40.0000 mg | DELAYED_RELEASE_TABLET | Freq: Two times a day (BID) | ORAL | Status: DC
Start: 2021-03-19 — End: 2021-03-22
  Administered 2021-03-19 – 2021-03-22 (×7): 40 mg via ORAL
  Filled 2021-03-19 (×7): qty 1

## 2021-03-19 MED ORDER — GENTAMICIN SULFATE 0.1 % EX CREA
1.0000 | TOPICAL_CREAM | CUTANEOUS | Status: DC | PRN
Start: 2021-03-19 — End: 2021-03-19

## 2021-03-19 MED ORDER — SODIUM CHLORIDE 0.9 % IV SOLN
1000.0000 mg | Freq: Once | INTRAVENOUS | Status: AC
Start: 2021-03-19 — End: 2021-03-19
  Administered 2021-03-19: 1000 mg via INTRAVENOUS
  Filled 2021-03-19 (×2): qty 20

## 2021-03-19 MED ORDER — LOSARTAN POTASSIUM 25 MG OR TABS
25.0000 mg | ORAL_TABLET | Freq: Every day | ORAL | Status: DC
Start: 2021-03-19 — End: 2021-03-22
  Administered 2021-03-19 – 2021-03-22 (×4): 25 mg via ORAL
  Filled 2021-03-19 (×4): qty 1

## 2021-03-19 MED ORDER — APIXABAN 5 MG OR TABS
5.0000 mg | ORAL_TABLET | Freq: Two times a day (BID) | ORAL | Status: DC
Start: 2021-03-19 — End: 2021-03-22
  Administered 2021-03-19 – 2021-03-22 (×7): 5 mg via ORAL
  Filled 2021-03-19 (×7): qty 1

## 2021-03-19 NOTE — H&P (Signed)
History and Physical     [name]") - DOB: 1966-07-06 55 year old male)  PCP: Pcp, Outside   Code Status: Full Code       CHIEF CONCERN / IDENTIFICATION:  Casey Wong is a 55 year old male with PMH as below who presented to EMS for SOB and then intubated -> transferred to CICU for hypoxic respiratory failure thought 2/2 flash pulmonary edema and volume overload, now extubated on room air and transferred to general medicine.      SUBJECTIVE   HISTORY OF PRESENT ILLNESS:   Casey Wong is a 55 year old male with PMH  #NICM EF 55%  #non obs CAD   #ESRD on PD  #HTN  #multiple thrombi- bilateral IJ, R subclavian, previous DVT on apixaban and asa    Per Dr. Guy Begin notes in the CICU:   Patient was intubated in the field after calling 911 for SOB. CXR showed bilateral pleural effusions/pulmonary edema. AHRF thought 2/2 to flash pulmonary edema, so was placed on NTG gtt for BP control (goal SBP <150) and PD x 24h done for volume optimization. Able to be extubated to room air on 8/15. Initially on NTG gtt for BP control and then after extubation was transitioned to PO regimen of hydralazine QID, losartan, and carvedilol, uptitrating to achieve BP goal. He also was noted to have low hgb on admission of 6.6 w/ baseline around 1-8. He was given 1u pRBC with bump to 7.2 and NGT noted to have coffee ground emesis. GI consulted for possible EGD but given Hgb stability and improvement in symptoms plan to defer to outpatient. Started on BID PPI. For this hx of clots, asa was stopped and he was continued on apixaban.     On interview: patient reports that he feels much better. He is interested in pursuing PD w/ a machine at home instead of manual fills. No trouble breathing of eating. No further bleeding, nausea, vomiting. He holds his fluid in his face and noticed that his face is much thinner than before. Looking forward to going back to home to go fishing.     Review of Systems  I have  performed a complete review of systems with the patient, which is negative except as indicated per HPI.      HISTORY   Problem List   Diagnosis    Acute decompensated heart failure (HCC)       Past Surgical History:   Procedure Laterality Date    peritoneal dialysis shunt insertion      TUNNELED VENOUS CATHETER PLACEMENT         Social History     Tobacco Use    Smoking status: Former Smoker     Packs/day: 0.25     Years: 28.00     Pack years: 7.00     Types: Cigarettes     Quit date: 09/22/2017     Years since quitting: 3.4   Substance and Sexual Activity    Alcohol use: Not Currently    Drug use: Yes     Types: Marijuana     [place]ives at home alone[place], enjoys [place]    No family history on file.    I have not updated the family history in Epic.    OUTPATIENT MEDICATIONS:   Current Outpatient Medications   Medication Instructions    amLODIPine (NORVASC) 10 mg, Oral, Daily  calcitriol (ROCALTROL) 0.25 mcg, Oral, Daily    cloNIDine (CATAPRES) 0.2 mg, Oral    gabapentin (NEURONTIN) 100 mg, Oral    hydrALAZINE 50 MG tablet Oral       ALLERGIES:   Ceftaroline, Ceftazidime, Codeine, Lisinopril, and Penicillins      OBJECTIVE     Vitals (Arrival)      T: (!) 35.8 C (03/17/21 2000)  BP: 109/65 (03/17/21 2000)  HR: 69 (03/17/21 1950)  RR: 20 (03/17/21 1950)  SpO2: 98 % (03/17/21 1950) Ventilator   30 %   Vitals (Most recent in last 24 hrs)   T: 36.3 C (03/19/21 0800)  BP: (!) 150/97 (03/19/21 1118)  HR: 88 (03/19/21 1118)  RR: 15 (03/19/21 1118)  SpO2: 97 % (03/19/21 1118) Room air  T range: Temp  Min: 36.3 C  Max: 36.8 C  Wt 157 lb 10.1 oz (71.5 kg)     Ht 5' 10.866" (1.8 m)     Body mass index is 22.07 kg/m.       Physical Exam  Vitals and nursing note reviewed.   Constitutional:       General: He is not in acute distress.     Appearance: Normal appearance. He is normal weight. He is not ill-appearing or toxic-appearing.   HENT:      Head: Normocephalic and atraumatic.       Nose: Nose normal.      Mouth/Throat:      Mouth: Mucous membranes are moist.      Pharynx: Oropharynx is clear. No oropharyngeal exudate.   Eyes:      Extraocular Movements: Extraocular movements intact.      Conjunctiva/sclera: Conjunctivae normal.   Cardiovascular:      Rate and Rhythm: Normal rate and regular rhythm.      Pulses: Normal pulses.      Heart sounds: Normal heart sounds. No murmur heard.  Pulmonary:      Effort: Pulmonary effort is normal. No respiratory distress.      Breath sounds: Normal breath sounds. No stridor. No wheezing or rales.   Abdominal:      General: Abdomen is flat. Bowel sounds are normal. There is no distension.      Palpations: Abdomen is soft. There is no mass.      Tenderness: There is no abdominal tenderness.   Musculoskeletal:         General: No swelling or tenderness. Normal range of motion.      Cervical back: Normal range of motion.      Right lower leg: No edema.      Left lower leg: No edema.   Skin:     General: Skin is warm and dry.      Capillary Refill: Capillary refill takes less than 2 seconds.   Neurological:      General: No focal deficit present.      Mental Status: He is alert and oriented to person, place, and time. Mental status is at baseline.   Psychiatric:         Mood and Affect: Mood normal.         Behavior: Behavior normal.       Labs (last 24 hours):   Chemistries  CBC  LFT  Gases, other   139 95 77 119   8.3   AST: 10 ALT: 7  7.44/-/-/25   -/-/-/-   3.9 22 14.83   5.33 >< 175  AP: 97 T bili: 0.3  Lact (  v): -   eGFR: 4 Ca: 7.3   25   Prot: 5.7 Alb: 2.7  Trop I: - D-dimer: -   Mg: 2.0 PO4: 9.4  ANC: -     BNP: - Anti-Xa: -     ALC: -    INR: 1.1        MICRO:   I have personally reviewed the latest Micro results and cultures     IMAGING:   I have reviewed the latest radiology results        ASSESSMENT/PLAN        #AHRF, resolved   #Pulmonary edema  S/p intubation for AHRF, now extubated and on stable on room air. Volume management with  PD. Per nephro will run 12h tonight as well    #ESRD on PD  Cardiorenal was following, tomorrow will be general nephrology- need to figure out homegoing PD plan because he was manually doing PD and not doing enough of it. Anuric.   - Daily labs  - Sevelamer TID    #HTN  Hypertensive on arrival likely precipitating flash pulm edema. Now s/p nitroglycerin gtt and on oral meds. Uptitrating with goal SBO <150 and to decrease pill burden as much as possible.   - Hydralazine 75mg QID  - carvedilol 6.25mg BID started on 8/15  - losartan 25mg daily started on 8/16    #Coffee ground emesis  #c/f upepr GI bleed  Stable Hgb now, s/p 1u pRBC on 8/14. No further symptoms, GI eval and does not recommend EGD at this time.   - PPI BID   - IV iron 1g ferric carboxy x1 today, another in 2 weeks    #chronic multiple thrombi- bilateral IJ, R subclavian, previous DVT on apixaban and asa  - SICU d/c'd asa and kept on apixaban 5mg BID  - need to check into data regarding ac for chronic clots iso dialysis lines and what is indicated for him going forward       Inpatient Checklist:    Fluids/Electrolytes/Nutrition: po ad lib  Prophylaxis: systemic AC  Lines/Drains/Airways: PIV, Pd catheter  Disposition: home pending PD plan, BP regimen   Code Status: Full Code    Contacts: Primary Emergency Contact: STUBER,MICHAEL, Home Phone: [phone]

## 2021-03-19 NOTE — Nursing Note (Signed)
Patient Summary  35 YOM w/ NICM (EF 55%), non-occlusive CAD, ESRD on PD (multiple visits for complications of non-compliance),  bilateral IJ thrombus, R subclavian thrombus and previous DVT (on apixaban) transferred to [facility] for respiratory failure, FVO, and hypertensive emergency     8/14 Admit to CCU already intubated. 1 U PRBC. 12 hr PD run. NTG gtt  8/15 ECHO completed, GI consulted, no procedure needed at this time, extubated to RA. HR 120-130s for few hours (Atach?), started coreg. DC NTG gtt. NOC: NAEON. PD overnight.    Illness Severity  Stable

## 2021-03-19 NOTE — Progress Notes (Signed)
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      SUBJECTIVE   INTERVAL HISTORY:  - Extubated on 8/16 successfully to ambient air   - Continues on 24hr run of PD run with stable labs. Will finish around noon  - SBP ranging 100-150, continued on hydralazine and coreg. No PRNs given overnight  - No further episode of hematemesis and melena  - Tolerating oral diet well  - Continues to have intermittent HA that is well controlled with tylenol and po dilaudid   - Denies CP, SOB, abdominal pain, N/V, feeling lightheaded or dizzy with ambulation.     SCHEDULED MEDICATIONS:   atorvastatin, 40 mg, Daily  •  carVEDilol, 6.25 mg, BID  •  hydrALAZINE, 75 mg, q6h SCH  •  pantoprazole, 40 mg, q12h  •  sevelamer carbonate, 1,600 mg, TID with meals  •  thiamine, 100 mg, Daily    INFUSED MEDICATIONS:  nitroGLYCERIN, 0-4 mcg/kg/min (Dosing Weight), Continuous, Last Rate: Stopped (03/18/21 1716)  •  sodium chloride, 10 mL/hr, Continuous, Last Rate: 10 mL/hr (03/18/21 1600)     PRN MEDICATIONS:  •  acetaminophen, 650 mg, q6h PRN  •  calcium carbonate, 1,000 mg, TID PRN  •  gentamicin, 1 application, PRN  •  gentamicin, 1 application, PRN  •  HYDROmorphone, 1-2 mg, q6h PRN  •  ondansetron, 4 mg, q8h PRN  •  ondansetron, 4 mg, q8h PRN  •  senna, 17.2 mg, BID PRN       OBJECTIVE     Vitals (Most recent in last 24 hrs)     T: 36.4 °C (03/19/21 0400)  BP: (!) 144/82 (03/19/21 0532)  HR: 89 (03/19/21 0600)  RR: 20 (03/19/21 0400)  SpO2: 96 % (03/19/21 0600) Room air  T range:  Temp  Min: 36.1 °C  Max: 36.8 °C  Admit weight: 69 kg (152 lb 1.9 oz) (03/17/21 2050)  Last weight: 71.5 kg (157 lb 10.1 oz) (03/18/21 1250)       I&Os:     Intake/Output Summary (Last 24 hours) at 03/19/2021 0720  Last data filed at 03/19/2021 0000  Intake 1814.61 ml   Output 3631 ml   Net -1816.39 ml       Physical Exam  GEN: appears stated age, lying in bed, in NAD  HEENT: PERRL, EOMI, sclera anicteric. MMM  CV: RRR, s1 & s2 +, with no R/M/G. Elevated JVD (though known thrombus of RIJ) No clubbing, cyanosis, or edema, 2+ pulses bilaterally  RESP: LCTAB. No crackles, wheezes, or rhonchi   GI: Soft NT/ND, BS+, no HSM  MSK: Normal muscle bulk and tone  SKIN: No rashes on visible skin  NEURO: AOx4, CN II-XII grossly intact,moving all extremities spontaneously  PSYCH:appropriate mood and affect    Labs (last 24 hours):   Chemistries  CBC  LFT  Gases, other   [phone]   8.3   AST: 10 ALT: 7  7.44/-/-/25   -/-/-/-   3.9 22 14.83     5.33 >< 175  AP: 97 T bili: 0.3  Lact (a): - Lact (v): -   eGFR: 4 Ca: 7.3   25   Prot: 5.7 Alb: 2.7  Trop I: 0.10 D-dimer: -   Mg: 1.7 PO4: 9.4  ANC: -     BNP: - Anti-Xa: -     ALC: -    INR: 1.1        IMAGING:   I have reviewed the latest radiology results  TransTHORACIC echo (TTE) limited   Final Result by Freeman, Rosario Velazquez, MD (08/15 1244)      XR Chest 1 View   Final Result by Godwin II, J David, MD (08/15 0930)   Compared to 1258, lung volume has increased. Basal consolidation has decreased, but left lower lobe atelectasis or aspiration persists.      Heart size is normal and unchanged.      An enteric tube has been placed with side-port in the body of the stomach. Endotracheal tube position is unchanged.      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       #Acute hypoxemic Respiratory Failure- resolved   Intubated in field after calling 911 for SOB. Etiology likely 2/2 to volume overload and HTN urgency with some c/f flash pulmonary edema. CXR on admission prior to initiation of PD with bilateral pleural effusion and pulmonary edema. Repeat CXR subsequently with improved lung volumes. CXR without any significant consolidation. BNP elevated at 2300 on admission (unclear baseline BNP). At OSH NT-proBNP >70K. BNP 4/15 4654. Wt on discharge on 7/9 at OSH noted to being 71.8kg (unknown dry wt). Successfully extubated on 8/16 and has maintained O2 saturations >92% on ambient air.   Plan:  - Continue PD for volume management  - Continue HTN management  - discontinue IV Thiamine daily       #HTN emergency  Presented to OSH with SBP in the 200s requiring initiation of nitroglycerin gtt. Pt with prior hx of difficult to control HTN I/s/o volume overload during most recent prior hospitalizations. Most recent hospitalization in 6/22 patient was discharged on hydralazine 100mg TID, metoprolol succinate 100mg q24hr and isosorbide dinitrate 20mg TID. During this hospitalization pt was also started on nifedipine and minoxidil but was not continued on this on discharge. Unclear if patient was adherent to medication and PD regimen. Per chart review, pt has also been previously trialed on losartan, amlodipine, clonidine thought unclear why these were discontinued.  On 8/16: SBP improving with ongoing PD and anti-hypertensive. Weaned off nitroglycerin gtt on 8/16.  Patient would benefit from better anti-hypertensive regimen given trialed on multiple different meds over the course of past 6 months and due to compliance issues in the past would minimize oral regimen to daily or BID dosing.    Plan:  - s/p Nitroglycerin gtt  - SBP goal <160/80  - Continue hydralazine 75mg q6hrs--> consider transitioned back to TID dosing   - Continue coreg 6.25 mg BID   - Start losartan 25mg   Start losartan 17m qdaily   - Volume management as below      #ESRD on PD  #Hyperphosphatemia  #Hypocalcemia  Unclear etiology. Unclear dry wt. Pt reports wt home 167lbs. Recent discharge wt in July 71.8kg. Previously on HD but d/c due to access failures secondary to recurrent thrombotic events.  Poor compliance with care with patient appears to be initiated on PD in April 2022.  Has had multiple admissions for volume related I/s/o missed PD sessions and PD catheter issues. Unclear residual kidney function. No UOP since admission here. PD fluid analysis with no e/o peritonitis on cell count. Improvement in CXR since initiation of continuous PD.  Nephrology continued PD for additional 12 hours to challenge volume of in an effort to discontinue his nitro gtt. Successfully weaned off nitro gtt  Plan:  - BID BMP, Mg, PO4, iCa  - Continue PD  - continue Sevelamer TID  - Okay to give Ca to maintain iCa >1  - S/p Ca gluconate 2 g x1, and Ca gluconate 1g on 8/15      #Acute on chronic anemia- improving   #Coffee ground emesis/melena   #Anemia of CKD   Pt with hx of chronic anemia of CKD. Iron panel c/w anemia of chronic disease. H/H on admission 6.6/21 (baseline 7-8/22) requiring 1unit of pRBC with repeat H/H 7.2/22. Repeat on 8/15 Am with H/H 7.0/22. NGT with coffee ground GI output (~600cc outpt since admission). GI consulted to evaluate for possible EGD. Pt with hx of prior presentations with coffee ground emesis/melena with plans for workup in the outpatient session. Per GI ddx broad including NGT trauma, critical illness gastropathy, gastritis, PUD, among others all of which could be treated with PPI at this time. Per nephrology will eventually need EPO once stable from HTN standpoint   Plan:  - s/p 1unit of pRBC on 8/15  - Daily CBC  - Nephrology  following, appreciate recs   - Epo once stable from HTN standpoint   - GI consult, appreciate recs   - Low c/f significant GIB at this time.    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 Had admission in April at OSH after presenting with acute HF exacerbation and hypervolemia. TTE with mild reduction in EF from 55% to 40-45%. RHC/LHC with nonobstructive CAD. Bedside POCUS per admitting provider with similar EF as noted on prior TTEs with no significant WMA.   Plan:  - Discontinued ASA 812m - continue atorvastatin 53m57mily     #thrombus bilateral IJ  # R subclavian thrombus  # hx of DVT   Holding apixaban due to c/f GI bleed. H/H stable on 8/16 and will restart on home meds. Unclear if DVT provoked vs unprovoked.   Plan:  - restart home apixaban 5mg 46m  - Daily CBC    #Dispo  #GOC  Pt requesting to see social worker to help current housing situation. Post-extubation patient reported this provider that he has been considering hospice at one point but unclear this was situation I/s/o of recent hospitalization. Would benefit from ongoing GOC dNicholsussion.   Plan:  - Consult social worker  - continue GOC dArgyleussions       #  NSTEMI type II- resolved   Noted to having elevated troponin on admission here at 0.11 (at OSH 0.165-->0.118). Baseline 0.04-0.2. EKG with no significant ST changes but noted to having prolonged QTc at 522. Most likely 2/2 to demand ischemia.           ICU Checklist:    COVID-19 surveillance: negative 8/15  Delirium prevention/therapy: ICU delirium  Fluids/electrolytes:none  Diet: Adult Diet Full liquid  Minimum mobility goal: in bed  DVT Prophylaxis: apixiabn   GI Prophylaxis: PO PPI BID  Glucose control: <180  Lines/Drains/Airways: PD port  Labs:  BID BMP  Spine Precautions: none  Antibiotic stewardship: none  Disposition:Medicine   Code Status: Full Code    Contacts: Primary Emergency ContactLoreen Freud, Home Phone: [phone]  Next of Kin: STUBER,MICHAEL

## 2021-03-19 NOTE — Nursing Note (Addendum)
Patient Summary  24 YOM w/ NICM (EF 55%), non-occlusive CAD, ESRD on PD (multiple visits for complications of non-compliance),  bilateral IJ thrombus, R subclavian thrombus and previous DVT (on apixaban) transferred to [facility] for respiratory failure and hypertensive emergency thought to be due to missed PD and FVO/flash pulmonary edema    8/14 Admit to CCU already intubated. 1 U PRBC. 12 hr PD run. NTG gtt  8/15: ECHO completed. 600cc maroon output from OGT. GI consulted, PPI started, no procedure needed at this time, extubated to RA. HR 120-130s for few hours ([place]?) with conversion to NSR. Started scheduled coreg and hydralazine. DC NTG gtt. 12hr PD started.  8/16: Floor care, a-line removed. 12hr PD finished in AM. Losartan and eliquis restarted.      Illness Severity  Stable

## 2021-03-19 NOTE — Progress Notes (Addendum)
Progress Note     Casey Wong") - DOB: 22-Aug-1965 (55 year old male)  Admit Date: 03/17/2021  Code Status: Full Code       CHIEF CONCERN / IDENTIFICATION:  Casey Wong is a 55 year old man with a history of NICM, HFmrEF, CAD, ESRD on PD, admitted with hypertensive urgency post intubation in the field, whom I am asked to see for dialysis management.      SUBJECTIVE   INTERVAL HISTORY:  Extubated  Seen by GI - no procedure recommended at this time  Completed initial 12 hours of PD with 1.2L net UF  Completing overnight run currently  Off NTG gtt, started on coreg in addition to hydralazine   Feels overall well - states he has been performing PD nightly - 4 exchanges over 6 hours, 4.25% with no LF or MDE. States he is anuric and cites Dr. Lang Snow as his primary nephrologist   Labs reviewed    SCHEDULED MEDICATIONS:   atorvastatin, 40 mg, Daily    carVEDilol, 6.25 mg, BID    hydrALAZINE, 75 mg, q6h [place]    pantoprazole, 40 mg, BID before meals    sevelamer carbonate, 1,600 mg, TID with meals    INFUSED MEDICATIONS:  nitroGLYCERIN, 0-4 mcg/kg/min (Dosing Weight), Continuous, Last Rate: Stopped (03/18/21 1716)    sodium chloride, 10 mL/hr, Continuous, Last Rate: 10 mL/hr (03/18/21 1600)     PRN MEDICATIONS:    acetaminophen, 650 mg, q6h PRN    calcium carbonate, 1,000 mg, TID PRN    gentamicin, 1 application, PRN    gentamicin, 1 application, PRN    HYDROmorphone, 1-2 mg, q6h PRN    ondansetron, 4 mg, q8h PRN    ondansetron, 4 mg, q8h PRN    senna, 17.2 mg, BID PRN       OBJECTIVE     Vitals (Most recent in last 24 hrs)     T: 36.3 C (03/19/21 0800)  BP: (!) 144/103 (03/19/21 0844)  HR: 90 (03/19/21 0901)  RR: 16 (03/19/21 0901)  SpO2: 95 % (03/19/21 0901) Room air  T range: Temp  Min: 36.3 C  Max: 36.8 C  Admit weight: 69 kg (152 lb 1.9 oz) (03/17/21 2050)  Last weight: 71.5 kg (157 lb 10.1 oz) (03/18/21 1250)       I&Os:     Intake/Output Summary (Last 24 hours) at  03/19/2021 0944  Last data filed at 03/19/2021 0000  Intake 1650.5 ml   Output 3531 ml   Net -1880.5 ml       Physical Exam    Gen: sitting in bed, no acute distress  HEENT: anicteric conjunctivae  CV: RRR, no audible murmur, distended neck veins  Pulm: clear bilaterally, non-labored breathing on room air  Abd: distended (fluid in place), no pain with palpation, normoactive bowel tones  Ext: no edema  Skin: warm  Neuro:alert and conversant, without gross deficits  Access: PD catheter is accessed    Labs (last 24 hours):   Chemistries  CBC  LFT  Gases, other   139 95 77 119   8.3   AST: 10 ALT: 7  7.44/-/-/25   -/-/-/-   3.9 22 14.83   5.33 >< 175  AP: 97 T bili: 0.3  Lact (a): - Lact (v): -   eGFR: 4 Ca: 7.3   25   Prot: 5.7 Alb: 2.7  Trop I: - D-dimer: -   Mg: 2.0 PO4: 9.4  ANC: -  BNP: - Anti-Xa: -     ALC: -    INR: 1.1        IMAGING:   I have reviewed the latest radiology results           ASSESSMENT/PLAN      Casey Wong is a 55 year old man with a history of NICM, HFmrEF, CAD, ESRD on PD, admitted with hypertensive urgency post intubation in the field, whom I am asked to see for dialysis management.     1. 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 Poor compliance with care - appears he initiated PD in April of this year. He follows at [facility] - I talked to his nurse today and she states that he is historically non-compliant with all dialysis modalities. He is currently performing CAPD and using 2-3 bags per day - only 90 min dwell times to address volume issues since non-compliant with longer dwells. They have advised hemodialysis for better clearance though he has consistently declined. He is not getting enough clearance on his home Rx, which was known - will keep him on the cycler while here.     -Currently with ico in place. Plan for MDE to further challenge volume - then CCPD overnight 12 hours with  combination 4.25/2.5%. MDE with 4.25%. Continue 2.5L FVs with ico LF.   -gentamicin cream to exit site daily  -ensure daily bowel movements to maintain PD efficacy    2. Hypertension: Prior TW 75kg at the kidney center - has been a while so maybe lower. 69kg yesterday and stable at (71.5kg though with 2.5L in his abdomen). Based on his exam - albeit difficult - I'm not sure volume is driving his hypertension but will challenge for now as above.  He is currently on hydralazine and coreg. Would consider transitioning to longer acting agents given compliance issues, therefore TID medications, such as hydralazine, will prove difficult and risk rebound hypertension. Suggest ARB +/- amlodipine.     3. Anemia: Will start Epo. Additional concern for slow GIB. Would give IV iron - either 1g of ferric carboxymaltose x2 - 2 wks apart - or venofer x 5 doses.     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 Can give calcium to maintain iCa >1. Would hold off on activated vitamin D until his phos is better controlled.     Please page with questions. I discussed the above with the primary service. He will be transferring to a medicine service at which point the Nephrology consult team will assume care.

## 2021-03-20 ENCOUNTER — Encounter (HOSPITAL_COMMUNITY): Payer: Self-pay

## 2021-03-20 DIAGNOSIS — Z5181 Encounter for therapeutic drug level monitoring: Secondary | ICD-10-CM

## 2021-03-20 DIAGNOSIS — N189 Chronic kidney disease, unspecified: Secondary | ICD-10-CM

## 2021-03-20 DIAGNOSIS — N2581 Secondary hyperparathyroidism of renal origin: Secondary | ICD-10-CM

## 2021-03-20 DIAGNOSIS — Z79899 Other long term (current) drug therapy: Secondary | ICD-10-CM

## 2021-03-20 LAB — COMPREHENSIVE METABOLIC PANEL
ALT (GPT): 4 U/L — ABNORMAL LOW (ref 10–48)
AST (GOT): 9 U/L (ref 9–38)
Albumin: 2.8 g/dL — ABNORMAL LOW (ref 3.5–5.2)
Alkaline Phosphatase (Total): 93 U/L (ref 39–139)
Anion Gap: 19 — ABNORMAL HIGH (ref 4–12)
Bilirubin (Total): 0.3 mg/dL (ref 0.2–1.3)
Calcium: 7.6 mg/dL — ABNORMAL LOW (ref 8.9–10.2)
Carbon Dioxide, Total: 24 meq/L (ref 22–32)
Chloride: 93 meq/L — ABNORMAL LOW (ref 98–108)
Creatinine: 12.7 mg/dL — ABNORMAL HIGH (ref 0.51–1.18)
Glucose: 130 mg/dL — ABNORMAL HIGH (ref 62–125)
Potassium: 4 meq/L (ref 3.6–5.2)
Protein (Total): 5.7 g/dL — ABNORMAL LOW (ref 6.0–8.2)
Sodium: 136 meq/L (ref 135–145)
Urea Nitrogen: 67 mg/dL — ABNORMAL HIGH (ref 8–21)
eGFR by CKD-EPI 2021: 4 mL/min/{1.73_m2} — ABNORMAL LOW (ref >59)

## 2021-03-20 LAB — PROTHROMBIN TIME
Prothrombin INR: 1.2 (ref 0.8–1.3)
Prothrombin Time Patient: 15.3 s (ref 10.7–15.6)

## 2021-03-20 LAB — CBC (HEMOGRAM)
Hematocrit: 28 % — ABNORMAL LOW (ref 38.0–50.0)
Hemoglobin: 8.7 g/dL — ABNORMAL LOW (ref 13.0–18.0)
MCH: 28.7 pg (ref 27.3–33.6)
MCHC: 31.1 g/dL — ABNORMAL LOW (ref 32.2–36.5)
MCV: 92 fL (ref 81–98)
Platelet Count: 183 10*3/uL (ref 150–400)
RBC: 3.03 10*6/uL — ABNORMAL LOW (ref 4.40–5.60)
RDW-CV: 14.7 % — ABNORMAL HIGH (ref 11.6–14.4)
WBC: 6.12 10*3/uL (ref 4.3–10.0)

## 2021-03-20 LAB — PARTIAL THROMBOPLASTIN TIME: Partial Thromboplastin Time: 44 s — ABNORMAL HIGH (ref 22–35)

## 2021-03-20 LAB — PHOSPHATE: Phosphate: 6.9 mg/dL — ABNORMAL HIGH (ref 2.5–4.5)

## 2021-03-20 LAB — MAGNESIUM: Magnesium: 1.8 mg/dL (ref 1.8–2.4)

## 2021-03-20 LAB — CALCIUM (IONIZED), WB: Calcium (Ionized): 0.97 mmol/L — ABNORMAL LOW (ref 1.18–1.38)

## 2021-03-20 MED ORDER — GENTAMICIN SULFATE 0.1 % EX CREA
1.0000 | TOPICAL_CREAM | CUTANEOUS | Status: DC | PRN
Start: 2021-03-20 — End: 2021-03-22

## 2021-03-20 MED ORDER — OXYCODONE HCL 5 MG OR TABS
2.5000 mg | ORAL_TABLET | Freq: Four times a day (QID) | ORAL | Status: DC | PRN
Start: 2021-03-20 — End: 2021-03-22
  Administered 2021-03-20 – 2021-03-22 (×4): 5 mg via ORAL
  Filled 2021-03-20: qty 1
  Filled 2021-03-20: qty 2
  Filled 2021-03-20 (×2): qty 1

## 2021-03-20 NOTE — Progress Notes (Signed)
Progress Note     Casey Wong ("Pawel") - DOB: 10/12/1965 (55 year old male)  Admit Date: 03/17/2021  Code Status: Full Code       CHIEF CONCERN / IDENTIFICATION:  Casey Wong is a 55 year old man with NICM (EF 40-45%), non-obstructive CAD, ESRD on PD, HTN, bilateral IJ and R subclavian thrombus and prior DVT on apixaban who initially transferred from OSH to CCU with AHRF requiring intubation and hypertensive urgency, now transferred to medicine service     SUBJECTIVE   INTERVAL HISTORY:  -feeling well this AM  -having regular BMs, good appetite, eating okay, no nausea  -no dyspnea or chest pain    SCHEDULED MEDICATIONS:   apixaban, 5 mg, q12h SCH  •  atorvastatin, 40 mg, Daily  •  carVEDilol, 6.25 mg, BID  •  hydrALAZINE, 75 mg, q6h SCH  •  losartan, 25 mg, Daily  •  pantoprazole, 40 mg, BID before meals  •  sevelamer carbonate, 1,600 mg, TID with meals    INFUSED MEDICATIONS:  sodium chloride, 10 mL/hr, Continuous, Last Rate: 10 mL/hr (03/18/21 1600)     PRN MEDICATIONS:  •  acetaminophen, 650 mg, q6h PRN  •  calcium carbonate, 1,000 mg, TID PRN  •  gentamicin, 1 application, PRN  •  gentamicin, 1 application, PRN  •  HYDROmorphone, 1-2 mg, q6h PRN  •  ondansetron, 4 mg, q8h PRN  •  ondansetron, 4 mg, q8h PRN  •  senna, 17.2 mg, BID PRN       OBJECTIVE     Vitals (Most recent in last 24 hrs)     T: 36.4 °C (03/20/21 1600)  BP: (!) 128/92 (03/20/21 1800)  HR: 79 (03/20/21 1800)  RR: 16 (03/20/21 1600)  SpO2: 96 % (03/20/21 1600) Room air  T range: Temp  Min: 36.3 °C  Max: 36.6 °C  Admit weight: 69 kg (152 lb 1.9 oz) (03/17/21 2050)  Last weight: 67.2 kg (148 lb 1.6 oz) (03/20/21 1800)       I&Os:     Intake/Output Summary (Last 24 hours) at 03/20/2021 1813  Last data filed at 03/20/2021 1200  Intake 4380 ml   Output 6420 ml   Net -2040 ml       Physical Exam  lying comfortably in bed, no apparent distress  sclera anicteric  MM mooist  RRR, normal S1 and S2, no m/r/g  extremities warm and well  perfused, no LE edema  unlabored breathing on ambient air, CTAB, no w/r/r  normoactive bowel sounds, soft, nontender, nondistended, PD catheter in place c/d/i  skin warm, dry  normal muscle bulk, normal tone  moving all extremities without focal deficits  alert, oriented, normal mood and affect, thought process clear    Labs (last 24 hours):   Chemistries  CBC  LFT  Gases, other   136 93 67 130   8.7   AST: 9 ALT: 4  -/-/-/-   -/-/-/-   4.0 24 12.70   6.12 >< 183  AP: 93 T bili: 0.3  Lact (a): - Lact (v): -   eGFR: 4 Ca: 7.6   28   Prot: 5.7 Alb: 2.8  Trop I: - D-dimer: -   Mg: 1.8 PO4: 6.9  ANC: -     BNP: - Anti-Xa: -     ALC: -    INR: 1.2          ASSESSMENT/PLAN      53   55 year old man with NICM (EF 40-45%), non-obstructive CAD, ESRD on PD, HTN, bilateral IJ and R subclavian thrombus and prior DVT on apixaban who initially transferred from OSH to CCU with AHRF requiring intubation and hypertensive urgency, now transferred to medicine service    #ESRD on PD  #HTN/Volume  Hypertensive urgency on arrival causing flash pulmonary edema and requiring nitroglycerin gtt, weaned off with uptitration of PO anti-hypertensives. TW 75kg, though suspect true dry weight lower (~67kg here) thus nephrology planning to challenge  Unclear etiology of ESRD. Anuric. Follows with [facility]. Has been only using 90 min dwell times as outpatient as non-adherent with longer dwells, resulting inadequate clearance and volume removal on home prescription. Tells me that longer dwell times were difficult to manage with sleeps, believes machine should have been delivered to his home by now that will make it easier.  -appreciate nephrology consult  -PD per nephrology, planning to challenge dry weight  -f/u with nephrology and renal nurses to ensure home setup for PD adequate to reduce risk of recurrent volume overload and readmission  -stable at goal SBP <150, would aim to decrease pill burden as much as possible to improve  adherence  -continue hydralazine 18m Q6H, ideally could transition off hydralazine (to avoid rebound given issues with adherence) to amlodipine  -continue losartan  -continue coreg  -continue sevelamer TID  -continue     #Anemia of CKD  #Coffee ground emesis, resolved  Stable Hgb now, s/p 1u pRBC on 8/14. No further symptoms, GI eval and does not recommend EGD at this time.   -PO PPI BID   -IV iron 1g ferric carboxy x1 8/16, another in 2 weeks  -Epo with HD per nephro     #Chronic multiple thrombi- bilateral IJ, R subclavian, previous DVT  -CCU d/c'd home ASA and kept on apixaban 577mBID    #AHRF, resolved  Initially required intubation, likely due to volume overload and flash pulmonary edema with HTN urgency, continue to manage with PD as below    Inpatient Checklist:    Fluids/Electrolytes/Nutrition: AHA  Prophylaxis: therapeutic AC  Lines/Drains/Airways: PIV  Disposition: home, pending adequate BP and Vol control   Code Status: Full Code    Contacts: Primary Emergency Contact: STMcFarlandHome Phone: [phone]

## 2021-03-20 NOTE — Progress Notes (Signed)
Progress Note     Casey Wong") - DOB: 01-27-1966 (55 year old male)  Admit Date: 03/17/2021  Code Status: Full Code       CHIEF CONCERN / IDENTIFICATION:  Casey Wong is a 55 year old male with a history of NICM, HFmrEF, CAD, ESRD on PD, admitted with hypertensive urgency post intubation in the field, whom we are asked to see for dialysis management.      SUBJECTIVE   INTERVAL HISTORY:  Missed manual exchange yesterday  PD overnight with 2.7   Feels like he is euvolemic. No LE edema or dyspnea. Lying flat comfortably  No issues with PD overnight. Reports he does manual exchanges during the day with no fill overnight. Says he plans to get a cycler soon.    SCHEDULED MEDICATIONS:   apixaban, 5 mg, q12h SCH    atorvastatin, 40 mg, Daily    carVEDilol, 6.25 mg, BID    hydrALAZINE, 75 mg, q6h SCH    losartan, 25 mg, Daily    pantoprazole, 40 mg, BID before meals    sevelamer carbonate, 1,600 mg, TID with meals    INFUSED MEDICATIONS:  sodium chloride, 10 mL/hr, Continuous, Last Rate: 10 mL/hr (03/18/21 1600)     PRN MEDICATIONS:    acetaminophen, 650 mg, q6h PRN    calcium carbonate, 1,000 mg, TID PRN    gentamicin, 1 application, PRN    gentamicin, 1 application, PRN    HYDROmorphone, 1-2 mg, q6h PRN    ondansetron, 4 mg, q8h PRN    ondansetron, 4 mg, q8h PRN    senna, 17.2 mg, BID PRN       OBJECTIVE     Vitals (Most recent in last 24 hrs)     T: 36.4 C (03/20/21 0400)  BP: (!) 149/94 (03/20/21 0500)  HR: 84 (03/20/21 0500)  RR: 18 (03/20/21 0500)  SpO2: 90 % (03/20/21 0500) Room air  T range: Temp  Min: 36.3 C  Max: 36.8 C  Admit weight: 69 kg (152 lb 1.9 oz) (03/17/21 2050)  Last weight: 69.5 kg (153 lb 3.5 oz) (03/19/21 2100)       I&Os:     Intake/Output Summary (Last 24 hours) at 03/20/2021 0750  Last data filed at 03/20/2021 0200  Intake 2760 ml   Output 2738 ml   Net 22 ml     Physical Exam  GEN: comfortable appearing in bed  HEENT: Anicteric sclera  Lungs: CTAB,  no increased work of breathing  Cardiovascular: RRR, no mrg noted  Abdomen: normoactive bowel sounds, soft non tender, non distended, PD catheter c/d/i  Extremities:  warm and well perfused, no LE edema      Labs (last 24 hours):   Chemistries  CBC  LFT  Gases, other   136 93 67 130   8.7   AST: 9 ALT: 4  -/-/-/-   -/-/-/-   4.0 24 12.70   6.12 >< 183  AP: 93 T bili: 0.3  Lact (a): - Lact (v): -   eGFR: 4 Ca: 7.6   28   Prot: 5.7 Alb: 2.8  Trop I: - D-dimer: -   Mg: 1.8 PO4: 6.9  ANC: -     BNP: - Anti-Xa: -     ALC: -    INR: 1.2              ASSESSMENT/PLAN      Casey Wong is a 55 year old male with  a history of NICM, HFmrEF, CAD, ESRD on PD, admitted with hypertensive urgency post intubation in the field, whom we are asked to see for dialysis management.     #ESRD on PD  Unclear etiology. Follows at [place].  He is currently performing CAPD and using 2-3 bags per day - only 90 min dwell times to address volume issues since non-compliant with longer dwells. He is not getting enough clearance on his home Rx, which was known - will keep him on the cycler while here. Currently with ico in place. Appears euvolemic and BP better controlled  - Renally doses all medications for intermittent HD, eGFR <10   - CCPD overnight 12 hours, 5 cycles, 2.5L fills with combination 4.25/2.5%. MDE with 4.25%. icodextran last fill  -gentamicin cream to exit site daily  -ensure daily bowel movements to maintain PD efficacy    #Volume / Blood pressure   Dry wt is 75, though suspect true dry weight is lower - 67 kg here  - continue lisinopril, coreg, and hydralazine for now. Ideally would transition off hydralazine.    #Electrolytes/Acid-base: acceptable    #Anemia of ESRD   Hemoglobin:  8.7  Iron stores: received IV iron  - Epo 6k u TIW (ordered)    #Secondary hyperparathryodism   Phosphorus level:  6.9  Calcium level:  7.6  Phosphorus Binders:  Continue sevelamer    Pt seen and discussed in detail with attending Dr.  Wetzel Bjornstad, MD  Nephrology Fellow    We will continue to follow with you. Please page the primary nephrology contact listed in [place] with any questions or concerns. Thank you!

## 2021-03-20 NOTE — Progress Notes (Signed)
Initial Nutritional Status and Plan    55 year old male with PMH as below who presented to EMS for SOB and then intubated -> transferred to CICU for hypoxic respiratory failure thought 2/2 flash pulmonary edema and volume overload, now extubated on room air and transferred to general medicine.    General Height/Weight Data:  Weight for the past 168 hrs:   Weight   03/20/21 1250 66.7 kg (147 lb)   03/19/21 2100 69.5 kg (153 lb 3.5 oz)   03/19/21 1300 68.4 kg (150 lb 12.7 oz)   03/18/21 1250 71.5 kg (157 lb 10.1 oz)   03/18/21 1230 71.5 kg (157 lb 10.1 oz)   03/18/21 0210 69.5 kg (153 lb 3.5 oz)   03/17/21 2050 69 kg (152 lb 1.9 oz)     %  Male: [place] IBW/kg (Calculated) Male: 77.65    BMI: Body mass index is 20.58 kg/m.  BMI Classification: 18.5-24.9: Healthy weight    Allergies:  Review of patient's allergies indicates:  Allergies   Allergen Reactions    Ceftaroline Unknown     unknown    Ceftazidime Flushing     Self-resolving flushing/nausea/diaphoresis; received cefepime, cefazolin, ceftriaxone.    Codeine Unknown     unknown    Lisinopril Unknown     unknown    Penicillins Unknown     unknown        Active Diet Orders   Diet    Adult Diet Regular; Heart healthy     Active Nourishment Orders   There are no active orders of the following types: Nourishments.     Interventions:  Discussed food allergies/preferences/intolerances with patient. and Discussed current intake and tolerance with patient.  To maintain nutritional status/weight.    Patient denied nausea, food allergies; reported eating fine; reported usual weight of 167 lbs; had no nutritional questions or concerns.    Plan:  Monitor oral intake and tolerance.    Heather Roberts, DT

## 2021-03-20 NOTE — Nursing Note (Signed)
Patient Summary  55 YOM w/ NICM (EF 55%), non-occlusive CAD, ESRD on PD (multiple visits for complications of non-compliance),  bilateral IJ thrombus, R subclavian thrombus and previous DVT (on apixaban) transferred to [facility] for respiratory failure and hypertensive emergency thought to be due to missed PD and FVO/flash pulmonary edema    8/14 Admit to CCU already intubated. 1 U PRBC. 12 hr PD run. NTG gtt  8/15: ECHO completed. 600cc maroon output from OGT. GI consulted, PPI started, no procedure needed at this time, extubated to RA. HR 120-130s for few hours ([place]?) with conversion to NSR. Started scheduled coreg and hydralazine. DC NTG gtt. 12hr PD started.  8/16: Floor care, a-line removed. 12hr PD finished in AM. Losartan and eliquis restarted.  Edited by: Harlon Ditty, RN at 03/19/2021 1820    Illness Severity  Stable  Edited by: Harlon Ditty, RN at 03/19/2021 1501

## 2021-03-20 NOTE — Hospital Course (Addendum)
Hospital Course:   55 year old man with NICM (EF 40-45%), non-obstructive CAD, ESRD on PD, HTN, bilateral IJ and R subclavian thrombus and prior DVT on apixaban who initially transferred from OSH to CCU with AHRF requiring intubation and hypertensive urgency, now transferred to the Medicine service.    Hypertensive urgency on arrival causing flash pulmonary edema and requiring nitroglycerin gtt, weaned off with uptitration of PO antihypertensives; BP has normalized. Prior TW 75kg, though suspect true dry weight lower. He appeared euvolemic at 67kg, which Nephrology felt to be his new dry weight.  Unclear etiology of ESRD (anuric). Follows with [place]. Had been only using 90 min dwell times as outpatient as non-adherent with longer dwells, resulting inadequate clearance and volume removal on home prescription. Reportedly, longer dwell times were difficult due to interference with sleep. He reported that his his new machine has been delivered to his home, which may make things easier; he called his PD RN to get trained on the new machine, and Nephrology has asked the [facility] Renal RNs to help coordinate.    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 Nephrology challenged dry weight: new estimated TW of ~67 kg.    Plan:  -continue amlodipine '10mg'$  daily  -continue losartan  -continue coreg  -stopped scheduled hydralazine to simply dosing  -did not resume prior isosorbide or clonide  -continue sevelamer TID  -continue PD per Nephrology    #ESRD on PD  Continued on PD per Nephrology for volume managment, using new dry weight of ~67 kg. [facility] Nephrology and our Renal nurses coordinated with his home [facility] team to ensure home setup and training for his new PD machine to reduce risk of recurrent volume overload and readmission; patient called his PD RN as well.    #Anemia of  CKD  #Coffee ground emesis, resolved  Stable Hgb now, s/p 1u pRBC on 8/14. No further symptoms, GI evaluated and did not recommend EGD. No further signs/sx of bleeding.  -PO PPI BID   -s/p IV iron 1g ferric carboxy x1 8/16, another in 2 weeks  -Epo if needed per Nephrology     #Chronic multiple thrombi- bilateral IJ, R subclavian, previous DVT on chronic anticoagulation  -CCU stopped his home ASA   -continue apixaban '5mg'$  BID     #Acute hypoxemic respiratory failure with acute pulmonary edema, resolved  Initially required intubation, likely due to volume overload and flash pulmonary edema with HTN urgency, continued to manage with PD.    Key Diagnostic Studies:    TransTHORACIC echo (TTE) limited   Final Result by Carolyne Littles, MD (08/15 1244)   Conclusion  Normal left ventricular chamber size with mild concentric hypertrophy.  Normal systolic function with EF 55% and no regional wall motion  abnormalities.  Right ventricular size and function are normal.  Moderate mitral annular calcification.  Trace mitral and tricuspid  regurgitation.  Pulmonary pressures are 48mHg over central venous pressure (patient is on  positive pressure ventilation).  Trace pericardial effusion.  Abdominal ascites and pleural effusion noted.      XR Chest 1 View   Final Result by GAlvera Novel MD (08/15 0930)   Compared to 1258, lung volume has increased. Basal consolidation has decreased, but left lower lobe atelectasis or aspiration persists.   Heart size is normal and unchanged.   An enteric tube has  been placed with side-port in the body of the stomach. Endotracheal tube position is unchanged.      XR Abdomen 1 View   Final Result by Iven Finn, MD (08/15 0116)   A non-weighted enteric tube has been placed, with its distalmost visualized portion projected over the gastric antrum.   Gas and stool noted in the distribution of the colon, in a non-obstructive pattern.      Procedures/Dates:   Central  line 03/17/2021 (removed 8/15)  A-line 03/17/2021 (removed 8/16)  Extubated 03/18/2021    Consults:   Cardiac ICU (admitting service)  Nephrology  Gastroenterology  PT/OT  RT  Nutrition  Social Work      Key Follow-up Needed, including Incidental Findings:     -s/p IV iron 1g ferric carboxy x1 8/16, recommend another in 2 weeks

## 2021-03-20 NOTE — Progress Notes (Signed)
Physical Therapy Evaluation    Casey Wong  [id number]     03/20/2021  Treatment Duration (min): 10 Mins    Patient Active Problem List   Diagnosis    Acute decompensated heart failure (HCC)       Past Medical History:   Diagnosis Date    Brachiocephalic vein obstruction with collaterals     CAD (coronary artery disease)     Cellulitis     CHF (congestive heart failure) (HCC)     Depression     DVT (deep venous thrombosis) (HCC)     ESRD (end stage renal disease) ([place])     Hypertension     Internal jugular vein thrombosis (HCC)     due to multiple prior HD access, bilateral IJs and right EJ occluded; only recent access has been femoral    Migraine     Neuropathy        Past Surgical History:   Procedure Laterality Date    peritoneal dialysis shunt insertion      TUNNELED VENOUS CATHETER PLACEMENT         Home Living  Home Living  Type of Home: House  Entry Stairs: Right railing  Indoor Stairs: 12  Indoor Stairs Railings: Right railing  Lives With: Family  Mobility Equipment Owned: Single point cane    Prior Level of Function  Prior Function  Prior Level of Function: Independent with all functional mobility and ADLs  Details on Prior Level of Function: had used his cane about a month ago for one week, tripped and injured his foot, couldn't bear weight, now back to no device  Type of Occupation: retired  Leisure and Hobbies: fishing on the [place] he lives on - uses canoe  Assistance Available at Home: None    Extremity Assessments  RUE Assessment  RUE Assessment: Within Functional Limits  LUE Assessment  LUE Assessment: Within Functional Limits  RLE Assessment  RLE Assessment: Within Functional Limits  LLE Assessment  LLE Assessment: Within Functional Limits    Sensation  Sensation  Light Touch: No apparent deficits  Coordination  Coordination  Movements are Fluid and Coordinated: Yes  Anthropometric Measurements  Anthropometric Measurements  Weight: 66.7 kg (147 lb)  Static Sitting Balance  Static  Sitting Balance  Static Sitting Level of Assistance: Independent  Dynamic Sitting Balance  Dynamic Sitting Balance  Dynamic Sitting Balance Level of Assist: Independent  Static Standing Balance  Static Standing Balance  Static Standing Balance Level of Assistance: Independent  Dynamic Standing Balance  Dynamic Standing Balance  Dynamic Standing Balance Level of Assist: Independent  Activity Tolerance  Activity Tolerance  Endurance: Tolerates less than 10 min of activity, no significant change in vital signs    Functional Level:  Bed Mobility:   Supine>Sit: Independent  Sit>Supine: Independent  Transfers:   Sit<>Stand: Independent   Ambulation:   Distance: 420 feet  Assistance Needs: Independent  Assistive Device Used: None      Interventions:  Therex:   - pt provided with HF phase I essentials handout - educated on progressive walking program, pacing with activity, and daily ther ex as a means to advance his activity tolerance and endurance. Pt indicates understnading and was thankful                              ASSESSMENT/PLAN     Pt presenting to PT at or near his baseline, with impaired activity tolerance and  endurance.  He requires no assist for mobilty and paces himself well.  He was educated on activity with HF and provided with handouts to slowly increase his activity over the next six weeks. He has no further acute care PT needs at this time.    Barriers to Discharge: None      Goals:  Goal: Bed mobility, Independent  Estimated Completion Date: 03/20/21  Goal Status: Met   Goal: Transfer without AD, Amb without AD, Independent  Estimated Completion Date: 03/20/21  Goal Status: Met   Goal: Stairs, Independent  Estimated Completion Date: 03/20/21  Goal Status: Adequate for discharge  Goal: Pt will score > 20 on AMPAC  Estimated Completion Date: 03/20/21  Goal Status: Met  Goal: Pt will demonstrate I with HEP  Estimated Completion Date: 03/20/21  Goal Status: Adequate for discharge    PT Plan of  Care:  Treatment/Interventions: Therapeutic exercise, Therapeutic functional activities, Patient/family training   PT Plan: No skilled PT/Discharge PT       Plan for Next Session: Goals Met    Other Recommendations:  PT Discharge Recommendations: No follow up PT indicated   PT Daily Recommendations: ambulating in hallway 3x/day for ~ 5 mins each time    Truitt Merle, PT

## 2021-03-21 ENCOUNTER — Encounter (HOSPITAL_COMMUNITY): Payer: Self-pay

## 2021-03-21 ENCOUNTER — Other Ambulatory Visit (HOSPITAL_BASED_OUTPATIENT_CLINIC_OR_DEPARTMENT_OTHER): Payer: Self-pay

## 2021-03-21 DIAGNOSIS — E8770 Fluid overload, unspecified: Secondary | ICD-10-CM

## 2021-03-21 DIAGNOSIS — I5030 Unspecified diastolic (congestive) heart failure: Secondary | ICD-10-CM

## 2021-03-21 LAB — CBC (HEMOGRAM)
Hematocrit: 30 % — ABNORMAL LOW (ref 38.0–50.0)
Hemoglobin: 9.6 g/dL — ABNORMAL LOW (ref 13.0–18.0)
MCH: 28.9 pg (ref 27.3–33.6)
MCHC: 31.8 g/dL — ABNORMAL LOW (ref 32.2–36.5)
MCV: 91 fL (ref 81–98)
Platelet Count: 179 10*3/uL (ref 150–400)
RBC: 3.32 10*6/uL — ABNORMAL LOW (ref 4.40–5.60)
RDW-CV: 14.4 % (ref 11.6–14.4)
WBC: 4.51 10*3/uL (ref 4.3–10.0)

## 2021-03-21 LAB — LAB ADD ON ORDER

## 2021-03-21 LAB — BASIC METABOLIC PANEL
Anion Gap: 13 — ABNORMAL HIGH (ref 4–12)
Calcium: 7.9 mg/dL — ABNORMAL LOW (ref 8.9–10.2)
Carbon Dioxide, Total: 28 meq/L (ref 22–32)
Chloride: 91 meq/L — ABNORMAL LOW (ref 98–108)
Creatinine: 10.81 mg/dL — ABNORMAL HIGH (ref 0.51–1.18)
Glucose: 101 mg/dL (ref 62–125)
Potassium: 4 meq/L (ref 3.6–5.2)
Sodium: 132 meq/L — ABNORMAL LOW (ref 135–145)
Urea Nitrogen: 53 mg/dL — ABNORMAL HIGH (ref 8–21)
eGFR by CKD-EPI 2021: 5 mL/min/{1.73_m2} — ABNORMAL LOW (ref >59)

## 2021-03-21 LAB — PARTIAL THROMBOPLASTIN TIME: Partial Thromboplastin Time: 40 s — ABNORMAL HIGH (ref 22–35)

## 2021-03-21 LAB — MAGNESIUM: Magnesium: 1.7 mg/dL — ABNORMAL LOW (ref 1.8–2.4)

## 2021-03-21 LAB — PHOSPHATE: Phosphate: 6.7 mg/dL — ABNORMAL HIGH (ref 2.5–4.5)

## 2021-03-21 LAB — CALCIUM (IONIZED), WB: Calcium (Ionized): 1.03 mmol/L — ABNORMAL LOW (ref 1.18–1.38)

## 2021-03-21 MED ORDER — PANTOPRAZOLE SODIUM 40 MG OR TBEC
40.0000 mg | DELAYED_RELEASE_TABLET | Freq: Two times a day (BID) | ORAL | 0 refills | Status: AC
Start: 2021-03-22 — End: 2021-04-21
  Filled 2021-03-21: qty 60, 30d supply, fill #0

## 2021-03-21 MED ORDER — HYDRALAZINE HCL 50 MG OR TABS
75.0000 mg | ORAL_TABLET | Freq: Four times a day (QID) | ORAL | Status: DC | PRN
Start: 2021-03-21 — End: 2021-03-22

## 2021-03-21 MED ORDER — MAGNESIUM SULFATE 2 GM/50ML SWFI IV SOLN
2.0000 g | Freq: Once | INTRAVENOUS | Status: AC
Start: 2021-03-21 — End: 2021-03-21
  Administered 2021-03-21: 2 g via INTRAVENOUS
  Filled 2021-03-21: qty 50

## 2021-03-21 MED ORDER — CALCIUM GLUCONATE-NACL 1-0.675 GM/50ML-% IV SOLN
1.0000 g | Freq: Once | INTRAVENOUS | Status: AC
Start: 2021-03-21 — End: 2021-03-21
  Administered 2021-03-21: 1 g via INTRAVENOUS
  Filled 2021-03-21: qty 50

## 2021-03-21 MED ORDER — AMLODIPINE BESYLATE 10 MG OR TABS
10.0000 mg | ORAL_TABLET | Freq: Every day | ORAL | Status: DC
Start: 2021-03-21 — End: 2021-03-22
  Administered 2021-03-21 – 2021-03-22 (×2): 10 mg via ORAL
  Filled 2021-03-21 (×2): qty 1

## 2021-03-21 MED ORDER — LOSARTAN POTASSIUM 25 MG OR TABS
25.0000 mg | ORAL_TABLET | Freq: Every day | ORAL | 0 refills | Status: AC
Start: 2021-03-22 — End: 2021-04-21
  Filled 2021-03-21: qty 30, 30d supply, fill #0

## 2021-03-21 MED ORDER — ATORVASTATIN CALCIUM 40 MG OR TABS
40.0000 mg | ORAL_TABLET | Freq: Every day | ORAL | 0 refills | Status: AC
Start: 2021-03-22 — End: 2021-04-21
  Filled 2021-03-21: qty 30, 30d supply, fill #0

## 2021-03-21 MED ORDER — AMLODIPINE BESYLATE 10 MG OR TABS
10.0000 mg | ORAL_TABLET | Freq: Every day | ORAL | 0 refills | Status: AC
Start: 2021-03-21 — End: 2021-04-21
  Filled 2021-03-21: qty 30, 30d supply, fill #0

## 2021-03-21 MED ORDER — CARVEDILOL 6.25 MG OR TABS
6.2500 mg | ORAL_TABLET | Freq: Two times a day (BID) | ORAL | 0 refills | Status: AC
Start: 2021-03-21 — End: 2021-04-21
  Filled 2021-03-21: qty 60, 30d supply, fill #0

## 2021-03-21 MED ORDER — SENNOSIDES 8.6 MG OR TABS
17.2000 mg | ORAL_TABLET | Freq: Two times a day (BID) | ORAL | 0 refills | Status: AC | PRN
Start: 2021-03-21 — End: ?
  Filled 2021-03-21: qty 60, 15d supply, fill #0

## 2021-03-21 MED ORDER — APIXABAN 5 MG OR TABS
5.0000 mg | ORAL_TABLET | Freq: Two times a day (BID) | ORAL | 0 refills | Status: AC
Start: 2021-03-21 — End: 2021-04-21
  Filled 2021-03-21: qty 60, 30d supply, fill #0

## 2021-03-21 MED ORDER — GENTAMICIN SULFATE 0.1 % EX CREA
1.0000 | TOPICAL_CREAM | Freq: Every day | CUTANEOUS | 0 refills | Status: AC | PRN
Start: 2021-03-21 — End: ?
  Filled 2021-03-21: qty 15, 15d supply, fill #0

## 2021-03-21 MED ORDER — ACETAMINOPHEN 325 MG OR TABS
650.0000 mg | ORAL_TABLET | Freq: Four times a day (QID) | ORAL | Status: AC | PRN
Start: 2021-03-21 — End: ?

## 2021-03-21 NOTE — Progress Notes (Signed)
Progress Note     Casey Wong") - DOB: [birth year] (55 year old male)  Admit Date: 03/17/2021  Code Status: Full Code       CHIEF CONCERN / IDENTIFICATION:  Casey Wong is a 55 year old man with NICM (HFmrEF 40-45%), non-obstructive CAD, ESRD on PD, HTN, bilateral IJ and R subclavian thrombi and prior DVT on apixaban who initially transferred from OSH to CCU with AHRF requiring intubation and hypertensive urgency, now transferred to Medicine service     SUBJECTIVE   INTERVAL HISTORY:  - net negative from 2.9L removed during PD yesterday    - Feeling well this morning.  No chest pain, headache, lightheadedness, or shortness of breath. Eating well; no nausea, vomiting, or abdominal pain  - He is tolerating peritoneal dialysis well, and denies swelling.  He feels like he is at his baseline volume status. He does not make any urine.    - He had a bowel movement yesterday and this morning.      - He says he was recently prescribed amlodipine, but he had not started taking it yet.  - [place]  He has a new peritoneal dialysis machine (it arrived), but he has not yet been trained on it.  He has left a message with his Peritoneal Dialysis Nurse at [facility], but he has not heard back yet.  He says he could just read the manual if needed.  - I asked our Nephrology team to request our Renal nurses reach out to his outpatient dialysis team to ensure he is adequately trained on the new peritoneal dialysis machine.    SCHEDULED MEDICATIONS:   amLODIPine, 10 mg, Daily    apixaban, 5 mg, q12h SCH    atorvastatin, 40 mg, Daily    carVEDilol, 6.25 mg, BID    losartan, 25 mg, Daily    pantoprazole, 40 mg, BID before meals    sevelamer carbonate, 1,600 mg, TID with meals    INFUSED MEDICATIONS:  sodium chloride, 10 mL/hr, Continuous, Last Rate: 10 mL/hr (03/18/21 1600)     PRN MEDICATIONS:    acetaminophen, 650 mg,  q6h PRN    calcium carbonate, 1,000 mg, TID PRN    gentamicin, 1 application, PRN    gentamicin, 1 application, PRN    ondansetron, 4 mg, q8h PRN    ondansetron, 4 mg, q8h PRN    oxyCODONE, 2.5-5 mg, q6h PRN    senna, 17.2 mg, BID PRN       OBJECTIVE     Vitals (Most recent in last 24 hrs)     T: 36.4 C (03/21/21 0800)  BP: 116/68 (03/21/21 0800)  HR: 79 (03/21/21 0400)  RR: 16 (03/21/21 0800)  SpO2: 98 % (03/21/21 0800) Room air  T range: Temp  Min: 36.3 C  Max: 36.4 C  Admit weight: 69 kg (152 lb 1.9 oz) (03/17/21 2050)  Last weight: 67.2 kg (148 lb 1.6 oz) (03/20/21 1800)       I&Os:     Intake/Output Summary (Last 24 hours) at 03/21/2021 0825  Last data filed at 03/21/2021 0600  Intake 1040 ml   Output 3293 ml   Net -2253 ml       Physical Exam  Sitting up comfortably in bed, no apparent distress  sclera anicteric  MMM  RRR, extremities warm and well perfused, no LE edema  unlabored breathing on  RA, lungs slightly coarse but otherwise clear, no crackles appreciated  normoactive bowel sounds, soft, nontender, nondistended; PD machine at bedside  skin warm, dry  appropriate muscle bulk, normal tone  moving all extremities without focal deficits  alert, fully oriented, pleasant mood, normal affect, thought process clear, goal-directed, good insight and judgement    Labs (last 24 hours):   Chemistries  CBC  LFT  Gases, other   132 91 53 101   9.6   AST: - ALT: -  -/-/-/-   -/-/-/-   4.0 28 10.81   4.51 >< 179  AP: - T bili: -  Lact (a): - Lact (v): -   eGFR: 5 Ca: 7.9   30   Prot: - Alb: -  Trop I: - D-dimer: -   Mg: 1.7 PO4: 6.7  ANC: -     BNP: - Anti-Xa: -     ALC: -    INR: -         Reviewed Results? Independently visualized & interpreted? Key Findings     Lab _0  _1  K 4, Mg 1.7   Radiology _2  _3     EKG/Tele/Echo  _4  _5  Telemetry: NSR mostly in the 80s to 90s, no significant ectopy   Other?  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 Prior TW 75kg, though suspect true dry weight lower. Appears euvolemic at 67kg, which seems to be his new dry weight; Nephrology agrees.   Unclear etiology of ESRD. Anuric. Follows with [place]. Has been only using 90 min dwell times as outpatient as non-adherent with longer dwells, resulting inadequate clearance and volume removal on home prescription. Reportedly, longer dwell times were difficult due to interference with sleep. He says his new machine has been delivered to his home, which may make things easier; he called his PD RN to get trained on the new machine, and Nephrology has asked the Renal RNs to help coordinate.  -appreciate Nephrology assistance  -PD per Nephrology for volume managment, using new dry weight of ~67 kg  -f/u with Nephrology (they will speak with the [facility] Renal nurses) to ensure home setup and training for PD adequate to reduce risk of recurrent volume overload and readmission. Patient has called his PD RN as well.  -stable at goal SBP <150, aiming to decrease pill burden to improve adherence  -start amlodipine 28m daily per discussion with Nephrology 8/18  -stop scheduled hydralazine but keep available [id number] PRN if SBP>180  -continue losartan  -continue coreg  -continue sevelamer TID    #Anemia of CKD  #Coffee ground emesis, resolved  Stable Hgb now, s/p 1u pRBC on 8/14. No further symptoms, GI eval and does not recommend EGD at this time.   -PO PPI BID   -IV iron 1g  ferric carboxy x1 8/16, another in 2 weeks  -Epo if needed per Nephro    #  Chronic multiple thrombi- bilateral IJ, R subclavian, previous DVT on chronic anticoagulation  -CCU d/c'd home ASA   -continue home apixaban 20m BID    # Hypocalcemia POA  Ionized Ca 1.03 on 8/18, slightly improved form <1; though corrected serum Ca ~8.9.  Given CMY/CHF and improved Phosphate, repleted gently 8/18.  - calcium gluconate 1gm IV x1  - follow ionized calcium levels and d/w Nephrology if needed    # Hypomagnesemia - replete PRN for goal Mg of 2    #Acute hypoxemic respiratory failure with acute pulmonary edema, resolved  Initially required intubation, likely due to volume overload and flash pulmonary edema with HTN urgency, continue to manage with PD as below    Inpatient Checklist:    Fluids/Electrolytes/Nutrition: AHA  Prophylaxis: therapeutic apixaban  Lines/Drains/Airways: PIV, PD catheter  Disposition: home 8/19 if adequate BPs and volume on current regimen  Code Status: Full Code    Contacts: Primary Emergency Contact: [place] Phone: [phone]   I spent a total of 35 minutes bedside/unit/floor on patient's care, with greater than 50% of the time counseling/coordinating care regarding counseling RIsaiaabout his improved BPs, plan for PD and need to get trained on his new machine; coordinating with Nephrology x2 re: starting amlodipine, stopping hydralazine (available PRN for now), coordinating with Renal RNs re: home PD macine/settings/training; coordinating with SW and the discharge planning team re: possible discharge tomorrow, transportation, meds, f/u

## 2021-03-21 NOTE — Progress Notes (Signed)
Progress Note     Anant Agard Boutelle") - DOB: 1966/03/27 (55 year old male)  Admit Date: 03/17/2021  Code Status: Full Code       CHIEF CONCERN / IDENTIFICATION:  Rafel Garde is a 55 year old male with a history of NICM, HFmrEF, CAD, ESRD on PD, admitted with hypertensive urgency post intubation in the field, whom we are asked to see for dialysis management.      SUBJECTIVE   INTERVAL HISTORY:  PD overnight with 2.9 L UF   Pt feels well today, no dyspnea, LE edema. Walking without issue  Reports a cycler has been mailed to his home and he's working with [facility] nurses for training    SCHEDULED MEDICATIONS:   apixaban, 5 mg, q12h [facility]    atorvastatin, 40 mg, Daily    carVEDilol, 6.25 mg, BID    hydrALAZINE, 75 mg, q6h [place]    losartan, 25 mg, Daily    pantoprazole, 40 mg, BID before meals    sevelamer carbonate, 1,600 mg, TID with meals    INFUSED MEDICATIONS:  sodium chloride, 10 mL/hr, Continuous, Last Rate: 10 mL/hr (03/18/21 1600)     PRN MEDICATIONS:    acetaminophen, 650 mg, q6h PRN    calcium carbonate, 1,000 mg, TID PRN    gentamicin, 1 application, PRN    gentamicin, 1 application, PRN    ondansetron, 4 mg, q8h PRN    ondansetron, 4 mg, q8h PRN    oxyCODONE, 2.5-5 mg, q6h PRN    senna, 17.2 mg, BID PRN       OBJECTIVE     Vitals (Most recent in last 24 hrs)     T: 36.3 C (03/20/21 2000)  BP: 136/89 (03/21/21 0400)  HR: 79 (03/21/21 0400)  RR: 18 (03/21/21 0400)  SpO2: 99 % (03/21/21 0400) Room air  T range: Temp  Min: 36.3 C  Max: 36.4 C  Admit weight: 69 kg (152 lb 1.9 oz) (03/17/21 2050)  Last weight: 67.2 kg (148 lb 1.6 oz) (03/20/21 1800)       I&Os:     Intake/Output Summary (Last 24 hours) at 03/21/2021 0817  Last data filed at 03/21/2021 0600  Intake 1040 ml   Output 3293 ml   Net -2253 ml     Physical Exam  GEN: comfortable appearing in bed  HEENT: Anicteric sclera  Lungs: CTAB, no increased work of breathing  Cardiovascular: RRR, no mrg noted  Abdomen: normoactive  bowel sounds, soft non tender, non distended, PD catheter c/d/i  Extremities:  warm and well perfused, no LE edema       Labs (last 24 hours):   Chemistries  CBC  LFT  Gases, other   132 91 53 101   9.6   AST: - ALT: -  -/-/-/-   -/-/-/-   4.0 28 10.81   4.51 >< 179  AP: - T bili: -  Lact (a): - Lact (v): -   eGFR: 5 Ca: 7.9   30   Prot: - Alb: -  Trop I: - D-dimer: -   Mg: - PO4: 6.7  ANC: -     BNP: - Anti-Xa: -     ALC: -    INR: -              ASSESSMENT/PLAN      Yosgar Demirjian is a 56 year old male with a history of NICM, HFmrEF, CAD, ESRD on PD, admitted with hypertensive urgency  post intubation in the field, whom we are asked to see for dialysis management.     #ESRD on PD  Unclear etiology. Follows at [facility].  He is currently performing CAPD and using 2-3 bags per day - only 90 min dwell times to address volume issues since non-compliant with longer dwells. He is not getting enough clearance on his home Rx, which was known - will keep him on the cycler while here. Currently with ico in place. Appears euvolemic and BP better controlled. Primary nephrologist: Lang Snow   - Renally doses all medications for intermittent HD, eGFR <10   - CCPD overnight 12 hours, 5 cycles, 2.5L fills with combination 4.25/2.5%.  icodextran last fill. If discharged tomorrow the last fill must be drained before he leaves  -gentamicin cream to exit site daily  -ensure daily bowel movements to maintain PD efficacy    #Volume / Blood pressure   Dry wt is 75, though suspect true dry weight is lower - 67 kg here  - continue lisinopril, coreg, and amlodipine for now.     #Electrolytes/Acid-base: acceptable    #Anemia of ESRD   Hemoglobin:  8.7  Iron stores: received IV iron  - Epo 6k u TIW (ordered)    #Secondary hyperparathryodism   Phosphorus level:  6.9  Calcium level:  7.6  Phosphorus Binders:  Continue sevelamer    Pt seen and discussed in detail with attending Dr. Wetzel Bjornstad, MD  Nephrology  Fellow    We will continue to follow with you. Please page the primary nephrology contact listed in [place] with any questions or concerns. Thank you!

## 2021-03-21 NOTE — Nursing Note (Signed)
Patient Summary  73 YOM w/ NICM (EF 55%), non-occlusive CAD, ESRD on PD (multiple visits for complications of non-compliance),  bilateral IJ thrombus, R subclavian thrombus and previous DVT (on apixaban) transferred to [facility] for respiratory failure and hypertensive emergency thought to be due to missed PD and FVO/flash pulmonary edema    8/14 Admit to CCU already intubated. 1 U PRBC. 12 hr PD run. NTG gtt  8/15: ECHO completed. 600cc maroon output from OGT. GI consulted, PPI started, no procedure needed at this time, extubated to RA. HR 120-130s for few hours ([place]?) with conversion to NSR. Started scheduled coreg and hydralazine. DC NTG gtt. 12hr PD started.  8/16: Floor care, a-line removed. 12hr PD finished in AM. Losartan and eliquis restarted.  NOC: PD overnight  Edited by: Alphonzo Severance, RN at 03/21/2021 0502    Illness Severity  Stable  Edited by: Harlon Ditty, RN at 03/19/2021 1501      Shift note 1600-1900: Assumed care @ 1600. A/Ox4, vss on RA. Denies pain/discomfort. Patient anuric. PD QHS. Mg replaced per MAR. Patient to d/c home tomorrow, supplies delivered to home.

## 2021-03-21 NOTE — Progress Notes (Signed)
Social Work Progress Note    ASSESSMENT:  Admit reason: This is a 55 year old old male admitted for Acute decompensated heart failure ([place]) [I50.9]  Acute decompensated heart failure ([place]) [I50.9].  Anticipated disposition/needs: 1-2 days to home via will-call [employer] taxi   Potential barriers/concerns:    Funding: Payor: Marine scientist / Plan: [place] PPO / Product Type: Medicare  Primary Decision Maker: Patient is their own decision maker    Outpatient Providers:  Patient Care Team:  Pcp, Outside as PCP - General (Unknown Physician Specialty)    INTERVENTIONS and REFERRALS:  The following interventions have been initiated: Consult with healthcare team;Discharge planning;Substance use disorder education, support, and resources provided     MD and [facility] indicate plan for pt to dc in 1-2 days and will require assistance with dc transportation.     Per chart review, pt lives with nephew in [place] and is independent with mobility and ADLs. Pt has [employer] and [employer]. Pt participated in PT evaluation and was deemed safe to dc back home without additional supportive services. Bedside RN confirmed dc address as [place], [place]    MSW completed [employer] request form and faxed it to [employer]. MSW received confirmation that a will-call taxi ride has been arranged for 8/19. Of note, [employer] desk is open until 1830 and can be contacted at [phone] for any last minute changes. After 1830, [employer] can be contacted at [phone].     PLAN or OUTCOME:   1-2 days to home via will-call [employer] taxi     Lajean Manes, MSW  General Surgery H,O,A,B, Medicine G and [place] [facility]

## 2021-03-21 NOTE — Progress Notes (Signed)
Occupational Therapy Evaluation Note      Patient Name: Casey Wong  MRN: [id number]  Today's Date: 03/21/2021  Treatment Duration (min): 32 Minutes    Home Living  Home Living  Type of Home: House  Entry Stairs: Right railing  Indoor Stairs: 12  Indoor Stairs Railings: Right railing  Lives With: Family  Bathroom Shower/Tub: Administrator, Civil Service: [place]: Single point cane (only needed for 1 week following a trip; was not using prior to coming to hospital)    Prior Function  Prior Function  BADLs: Independent  IADLs: Independent  Functional Mobility: Independent  Type of Occupation: retired  Leisure and Hobbies: fishing on the [place] he lives [place] - uses canoe  Assistance Available at Home: None    ADL and Functional Transfer Status reflective of last 24 hours                    Upper Body Dressing: Independent     Lower Body Dressing: Independent     Putting on/taking off footwear: Independent              Bathing Comments: Pt complete Shower safety screen with no LOB noted. Discussed shower stool for endurance during bathing tasks and where to purchase. Provided handouts on item and where to purchase    Bed Mobility  Supine>Sit: Independent  Sit>Supine: Independent    Transfers  Sit<>Stand: Independent           Edu on enery conervation strategies and fall prevention    See flowsheet for objective data reflective of today's session.    Assessment  Impact on Function and Participation: Tyreece pleaseant and agreeable to thearpy. He appears very close to his baseline function, with pt in agreement. He does report decreased act tolerance comapred to functional baseline. [place] was receptive to all education provided on energy conservation and fall preventiuon, providing teachback at end of sesison. He completed ADL tasks and functional mobility IND. Anticipate pt will continue to progress back to functional baseline. No acute care OT needs at this time, discontining  OT.    Barriers to Discharge: None  Evaluation/Treatment Tolerance: Patient tolerated treatment well  OT Summary for Next Shift: D/C OT    Discharge Recommendations  OT Discharge Recommendations: No follow up OT indicated  Assistance with Details: IADLs       Equipment Recommended: Shower seat without ack (for act tolerance during bathing tasks)    Goals   Goal: Upper body dressing, Lower body dressing, Independent  Estimated Completion Date: 03/21/21  Goal Status: Met              Goal: Provide verbal teachback of energy conservation strategies  Estimated Completion Date: 03/21/21  Goal Status: Met    Goal: Determine bathroom DME  Estimated Completion Date: 03/21/21  Goal Status: Met        Darrin Nipper, OT  03/21/2021

## 2021-03-21 NOTE — Discharge Instructions (Signed)
You were admitted to the Cardiac ICU for severely elevated blood pressures which caused a condition called pulmonary edema and led to respiratory failure, requiring you to be intubated.  Your blood pressure improved with special intravenous medications, and you were able to be extubated and transferred to the Internal Medicine service.  Your blood pressure has gradually improved to normal using the new combination of blood pressure medications.  Please take note of the changes, including initiation of amlodipine, losartan, carvedilol, and some of your previous blood pressure medications have been stopped.  The hydralazine that you have been taking requires multiple doses a day, so this has been discontinued for a more straightforward regimen.  This medication combination is also helpful for maintaining good cardiac function.  Your chronic heart failure has somewhat improved from before.    While you were in the hospital, your peritoneal dialysis was managed by the Nephrology team.  Through fluid removal, you are now at a lower estimated dry weight of approximately 67 kg.  Our Nephrology team will be in contact with your peritoneal dialysis nurse to coordinate getting you trained on your new dialysis machine at home and making sure you have the right supplies.  Please call your Dialysis Center if you have any questions on discharge.  Please follow-up with your primary care physician, nephrologist, and any other local specialists.    It was a pleasure taking care of you at the [facility] Hospitalist team

## 2021-03-22 ENCOUNTER — Other Ambulatory Visit (HOSPITAL_BASED_OUTPATIENT_CLINIC_OR_DEPARTMENT_OTHER): Payer: Self-pay

## 2021-03-22 LAB — BASIC METABOLIC PANEL
Anion Gap: 13 — ABNORMAL HIGH (ref 4–12)
Calcium: 8.5 mg/dL — ABNORMAL LOW (ref 8.9–10.2)
Carbon Dioxide, Total: 27 meq/L (ref 22–32)
Chloride: 89 meq/L — ABNORMAL LOW (ref 98–108)
Creatinine: 10.66 mg/dL — ABNORMAL HIGH (ref 0.51–1.18)
Glucose: 114 mg/dL (ref 62–125)
Potassium: 3.8 meq/L (ref 3.6–5.2)
Sodium: 129 meq/L — ABNORMAL LOW (ref 135–145)
Urea Nitrogen: 53 mg/dL — ABNORMAL HIGH (ref 8–21)
eGFR by CKD-EPI 2021: 5 mL/min/{1.73_m2} — ABNORMAL LOW (ref >59)

## 2021-03-22 LAB — CBC (HEMOGRAM)
Hematocrit: 31 % — ABNORMAL LOW (ref 38.0–50.0)
Hemoglobin: 10.2 g/dL — ABNORMAL LOW (ref 13.0–18.0)
MCH: 28.9 pg (ref 27.3–33.6)
MCHC: 32.5 g/dL (ref 32.2–36.5)
MCV: 89 fL (ref 81–98)
Platelet Count: 215 10*3/uL (ref 150–400)
RBC: 3.53 10*6/uL — ABNORMAL LOW (ref 4.40–5.60)
RDW-CV: 14.4 % (ref 11.6–14.4)
WBC: 6.59 10*3/uL (ref 4.3–10.0)

## 2021-03-22 LAB — CALCIUM (IONIZED), WB: Calcium (Ionized): 1.08 mmol/L — ABNORMAL LOW (ref 1.18–1.38)

## 2021-03-22 LAB — COVID-19 CORONAVIRUS QUALITATIVE PCR: COVID-19 Coronavirus Qual PCR Result: NOT DETECTED

## 2021-03-22 LAB — ALBUMIN, SERUM: Albumin: 2.9 g/dL — ABNORMAL LOW (ref 3.5–5.2)

## 2021-03-22 LAB — PHOSPHATE: Phosphate: 6.5 mg/dL — ABNORMAL HIGH (ref 2.5–4.5)

## 2021-03-22 LAB — MAGNESIUM: Magnesium: 1.8 mg/dL (ref 1.8–2.4)

## 2021-03-22 NOTE — Discharge Summary (Signed)
Discharge Summary     Casey Wong Casey Wong") - DOB: [birth year] 55 year old male)  PCP: Pcp, Outside   Code Status: Full Code        DATE OF ADMISSION: 03/17/2021  DATE OF DISCHARGE: 03/22/21  DISCHARGE TEAM & ATTENDING: Medicine G & Ottie Glazier, MD     ADMISSION DIAGNOSIS: Acute Decompensated Heart Failure   DISCHARGE DIAGNOSIS: ESRD on PD    PROBLEMS ADDRESSED DURING THIS HOSPITALIZATION:   Principal Problem:    Acute decompensated heart failure ([place])  Resolved Problems:    * No resolved hospital problems. *    DISCHARGE FOLLOW-UP VISITS/APPOINTMENTS:    [facility]coming [facility] at [facility]:  No future appointments.     PENDING RESULTS THAT REQUIRE FOLLOW-UP (as of this summary):  Pending Labs     Order Current Status    COVID-19 Coronavirus Qualitative PCR Preliminary result    Save Specimen For Future Testing Preliminary result    Transfuse RBC Preliminary result        ALLERGIES:  Ceftaroline, Ceftazidime, Codeine, Lisinopril, and Penicillins      DISCHARGE MEDICATIONS:   Current Discharge Medication List      START taking these medications    Details   acetaminophen 325 MG tablet Take 2 tablets (650 mg) by mouth every 6 hours as needed for mild pain, moderate pain, severe pain or headaches.      apixaban 5 MG tablet Take 1 tablet (5 mg) by mouth every 12 hours.  Qty: 60 tablet, Refills: 0      atorvastatin 40 MG tablet Take 1 tablet (40 mg) by mouth daily.  Qty: 30 tablet, Refills: 0      carVEDilol 6.25 MG tablet Take 1 tablet (6.25 mg) by mouth 2 times a day.  Qty: 60 tablet, Refills: 0      gentamicin 0.1 % cream Apply 1 application topically daily as needed for peritoneal dialysis/dressing change.  Qty: 15 g, Refills: 0      losartan 25 MG tablet Take 1 tablet (25 mg) by mouth daily.  Qty: 30 tablet, Refills: 0      pantoprazole 40 MG EC tablet Take 1 tablet (40 mg) by mouth 2 times a day before meals.  Qty: 60 tablet, Refills: 0      senna 8.6 MG tablet Take 2 tablets (17.2 mg) by mouth 2  times a day as needed for constipation.  Qty: 60 tablet, Refills: 0         CONTINUE these medications which have CHANGED    Details   amLODIPine 10 MG tablet Take 1 tablet (10 mg) by mouth daily.  Qty: 30 tablet, Refills: 0    Comments: May have been prescribed recently, but patient had not started.         CONTINUE these medications which have NOT CHANGED    Details   nitroGLYCERIN 0.4 MG SL tablet Place 0.4 mg under the tongue as needed for chest pain.      polyethylene glycol 3350 17 GM/SCOOP oral powder Take 17 g by mouth as needed.      sevelamer carbonate 800 MG tablet Take 1,600 mg by mouth 3 times a day with meals.         STOP taking these medications       hydrALAZINE 100 MG tablet Comments:   Reason for Stopping:         isosorbide dinitrate 20 MG tablet Comments:   Reason for Stopping:  metoprolol succinate ER 100 MG 24 hr tablet Comments:   Reason for Stopping:               BRIEF ADMISSION HISTORY:    See admission HPI from H&P 03/17/2021 by Vick Frees:  "[place] with PMH of PMH NICM (LVIDd 4.6, EF 40-45%), non-occlusive CAD, ESRD on PD with multiple ED visits and admissions for shortness of breath that appears to be secondary to volume overload due to non-compliance. Additional history includes NICM, non-occlusive CAD with coronary angiogram in April and echocardiogram (11/16/20) with mildly reduced EF 40-45%. In June 2022 he presented to the ED for grand mal seizure with fall and right frontal head injury. His workup with CT head and brain MRI were ultimately negative for acute CNS process; however patient had a prolonged post ictal/confused period.    On 03/17/21 he presented to [facility] with dyspnea and was intubated for respiratory failure. His K was 4.8, AG 20, Scr 18.35, Hgb 10 (03/04/21 was 8.7), pH 7.09, pCO2 67, bicarb 22; covid neg, trop 118 (n < 45), nt-pro BNP > 70 000. Of note review of care everywhere reveals Hgb 7.7 (02/08/21), 7.8 (02/06/21), 8.8  (02/04/21), 8.3 (02/03/21). Blood was noted in his OG tube, content was guaiac positive. Protonix bolus was given. COVID negative. NGL was initiated for HTN (SBP > 244mHg) and [facility] Central access was not done due to known bilateral IJ thrombosis, right subclavian and right femoral.    [facility]. During transport, BP trending up requiring higher titration of his NGL and propofol."    HOSPITAL COURSE:   Hospital Course:   55year old man with NICM (EF 40-45%), non-obstructive CAD, ESRD on PD, HTN, bilateral IJ and R subclavian thrombus and prior DVT on apixaban who initially transferred from OSH to CCU with AHRF requiring intubation. His AHRF was attributed to volume overload from inadequate PD (see below) and hypertensive emergency causing flash pulmonary edema, and he initially required a nitroglycerin gtt for his hypertensive emergency that was weaned off with uptitration of his PO antihypertensives and normalization of his BPs (see regimen below). He underwent PD while admitted with improvement in his respiratory status and was extubated successfully. His prior TW was 75kg although his suspected true dry weight was lower, and he appeared euvolemic at 67kg, which Nephrology felt to be his new dry weight.  Transferred to the acute care service on 03/19/2021 after successfuly extubation.     There is unclear etiology of his ESRD (anuric) and [facility] to this admission, he had been only using 90 min dwell times as outpatient as non-adherent with longer dwells, resulting inadequate clearance and volume removal on home prescription due to longer dwell times interfering with sleep. He now has a new machine that has been delivered to his home and his PD RN will train him on his new machine, which will hopefully improve adherence. [facility] RNs were able to reach out to the patient's regular PD nurses to  ensure that he had the appropriate supplies and training to use the new machine.     #Hypertensive Emergency (resolved)  #Chronic Hypertension  #Acute on chronic systolic HFmrEF POA   #Volume overload (resolved)  Improved BP control with uptitration of PO BP regimen and resolution of volume overload s/p PD while inpatient. Nephrology challenged dry weight: new estimated TW of ~  67 kg.    Plan:  -continue amlodipine '10mg'$  daily  -continue losartan  -continue coreg  -stopped scheduled hydralazine to simply dosing  -did not resume prior isosorbide or clonide  -continue sevelamer TID  -continue PD per Nephrology    #ESRD on PD  Continued on PD per Nephrology for volume managment, using new dry weight of ~67 kg. [facility] Nephrology and our Renal nurses coordinated with his home [facility] team to ensure home setup and training for his new PD machine to reduce risk of recurrent volume overload and readmission; patient called his PD RN as well.    #Anemia of CKD  #Coffee ground emesis, resolved  Stable Hgb now, s/p 1u pRBC on 8/14. No further symptoms, GI evaluated and did not recommend EGD. No further signs/sx of bleeding.  -PO PPI BID   -s/p IV iron 1g ferric carboxy x1 8/16, another in 2 weeks  -Epo if needed per Nephrology     #Chronic multiple thrombi- bilateral IJ, R subclavian, previous DVT on chronic anticoagulation  -CCU stopped his home ASA   -continue apixaban '5mg'$  BID     #Acute hypoxemic respiratory failure with acute pulmonary edema, resolved  Initially required intubation, likely due to volume overload and flash pulmonary edema with HTN emergency. Now saturating well on RA. Continue to manage with PD.     Key Diagnostic Studies:    TransTHORACIC echo (TTE) limited   Final Result by Carolyne Littles, MD (08/15 1244)   Conclusion  Normal left ventricular chamber size with mild concentric hypertrophy.  Normal systolic function with EF 55% and no regional wall motion  abnormalities.  Right ventricular size and  function are normal.  Moderate mitral annular calcification.  Trace mitral and tricuspid  regurgitation.  Pulmonary pressures are 20mHg over central venous pressure (patient is on  positive pressure ventilation).  Trace pericardial effusion.  Abdominal ascites and pleural effusion noted.      XR Chest 1 View   Final Result by GAlvera Novel MD (08/15 0930)   Compared to 1258, lung volume has increased. Basal consolidation has decreased, but left lower lobe atelectasis or aspiration persists.   Heart size is normal and unchanged.   An enteric tube has been placed with side-port in the body of the stomach. Endotracheal tube position is unchanged.      XR Abdomen 1 View   Final Result by RIven Finn MD (08/15 0116)   A non-weighted enteric tube has been placed, with its distalmost visualized portion projected over the gastric antrum.   Gas and stool noted in the distribution of the colon, in a non-obstructive pattern.      Procedures/Dates:   Central line 03/17/2021 (removed 8/15)  A-line 03/17/2021 (removed 8/16)  Extubated 03/18/2021    Consults:   Cardiac ICU (admitting service)  Nephrology  Gastroenterology  PT/OT  RT  Nutrition  Social Work      Key Follow-up Needed, including Incidental Findings:     -s/p IV iron 1g ferric carboxy x1 8/16, recommend another in 2 weeks       DISPOSITION:    01 HOME/SELF CARE [01]    CONDITION: stable     CONSULTS COMPLETED:    IP CONSULT TO SOCIAL WORK  IP CONSULT TO GASTROENTEROLOGY  IP CONSULT TO RESPIRATORY CARE     Discharge Orders   No activity restrictions     Activity: No Activity Restrictions      May shower  Hygiene: May Shower      Call / Return to the Clinic for the Following     Call / Return to Clinic for: Difficulty breathing / swallowing    Call / Return to Clinic for: Redness around incision site    Call / Return to Clinic for: Nausea or Vomiting    Call / Return to Clinic for: Temperature above 38C / 100.79F    Call / Return to Clinic for:  Difficulty voiding    Call / Return to Clinic for: Rash or itching    Call / Return to Clinic for: Pain not controlled by pain medications      Cardiac (low cholesterol/ low fat/ NAS)     Diet type: Restrict salt intake to less than 2000 mg per day    Diet type: Low cholesterol    Diet type: Low fat        DISCHARGE PHYSICAL EXAM:   Vitals (Most recent in last 24 hrs)     T: 36.2 C (03/22/21 0733)  BP: 126/84 (03/22/21 0733)  HR: 88 (03/22/21 0733)  RR: 16 (03/22/21 0733)  SpO2: 97 % (03/22/21 0733) Room air  T range: Temp  Min: 36.2 C  Max: 36.9 C  Admit weight: 69 kg (152 lb 1.9 oz) (03/17/21 2050)  Last weight: 66.3 kg (146 lb 3 oz) (03/21/21 1626)       Physical Exam  Sitting up comfortably in bed, no apparent distress  sclera anicteric  MMM  RRR, extremities warm and well perfused, no LE edema  unlabored breathing on RA, lungs slightly coarse but otherwise clear, no crackles appreciated  normoactive bowel sounds, soft, nontender, nondistended; PD machine at bedside  skin warm, dry  appropriate muscle bulk, normal tone  moving all extremities without focal deficits  alert, fully oriented, pleasant mood, normal affect, thought process clear, goal-directed, good insight and judgement    ATTENDING TIME STATEMENT:   I spent 30 minutes or less on hospital discharge day management.    [place] physicians mentioned in this note can be reached by calling the [facility] Medicine Paging Operator at [phone]. If any part of this transcript is missing or to request other transcripts for this patient call [phone]. For online access to patient records enroll in [place] [facility] [email or web address].

## 2021-03-22 NOTE — Diagnosis Clarification (Signed)
Patient has been managed for acute on chronic systolic heart failure with mid-range EF, POA.    Risk Factors: HTN, CAD, cardiomyopathy   Clinical Indicators: SOB, dyspnea, fluid overload, BNP, CXR, Echo   Treatment: IV Lasix, cardiac meds, Echo, lab monitoring

## 2021-03-22 NOTE — Progress Notes (Signed)
Per [facility] SW following patient who is anticipated to DC today. See previous SW notes. Yesterday, a ride was placed on will-call status with [employer].  SW confirmed today with Med Team and [place] that patient is [place] today. SW placed TC to [employer] and confirmed they have the ride confirmed. Patient will need to just present at the desk once [place]. SW relayed this to the RN, who confirmed patient about to head to the [place] and knows where it is.  At present, there are no other SW needs at this time. SW remains available as needed/requested.    [place], MSW  Social Work Care and Engineer, civil (consulting) of [facility]

## 2021-03-26 LAB — SAVE SPECIMEN FOR FUTURE TESTING: Save Specimen Number Of Days: 7

## 2021-05-23 ENCOUNTER — Emergency Department: Payer: Self-pay

## 2021-06-09 ENCOUNTER — Emergency Department: Payer: Self-pay

## 2021-06-26 ENCOUNTER — Emergency Department: Payer: Self-pay

## 2021-06-27 ENCOUNTER — Inpatient Hospital Stay: Payer: Self-pay

## 2021-07-18 ENCOUNTER — Emergency Department: Payer: Self-pay

## 2021-11-22 ENCOUNTER — Emergency Department: Payer: Self-pay

## 2021-12-09 ENCOUNTER — Emergency Department: Payer: Self-pay

## 2022-02-23 ENCOUNTER — Emergency Department: Payer: Self-pay

## 2022-03-06 ENCOUNTER — Emergency Department: Payer: Self-pay

## 2022-03-09 ENCOUNTER — Emergency Department: Payer: Self-pay

## 2022-03-09 ENCOUNTER — Inpatient Hospital Stay: Payer: Self-pay

## 2022-03-19 ENCOUNTER — Telehealth (HOSPITAL_BASED_OUTPATIENT_CLINIC_OR_DEPARTMENT_OTHER): Payer: Self-pay

## 2022-03-19 DIAGNOSIS — N186 End stage renal disease: Secondary | ICD-10-CM

## 2022-03-19 NOTE — Telephone Encounter (Signed)
Processed first Kidney Transplant referral.  Plan to obtain financial clearance and schedule patient for Neph Only visit.

## 2022-03-28 ENCOUNTER — Emergency Department: Payer: Self-pay

## 2022-03-29 ENCOUNTER — Emergency Department: Payer: Self-pay

## 2022-04-05 ENCOUNTER — Emergency Department: Payer: Self-pay

## 2022-04-26 ENCOUNTER — Emergency Department: Payer: Self-pay

## 2022-05-05 ENCOUNTER — Inpatient Hospital Stay: Payer: Self-pay

## 2022-05-05 ENCOUNTER — Emergency Department: Payer: Self-pay

## 2022-05-07 ENCOUNTER — Telehealth (HOSPITAL_BASED_OUTPATIENT_CLINIC_OR_DEPARTMENT_OTHER): Payer: Self-pay

## 2022-05-07 NOTE — Telephone Encounter (Signed)
First attempt made, called patient and left message to call back and schedule initial consultation.     Will wait for call back and reach out again if patient does not respond.

## 2022-05-10 ENCOUNTER — Inpatient Hospital Stay: Payer: Self-pay

## 2022-05-14 ENCOUNTER — Encounter (HOSPITAL_BASED_OUTPATIENT_CLINIC_OR_DEPARTMENT_OTHER): Payer: Self-pay

## 2022-05-14 NOTE — Telephone Encounter (Signed)
Error

## 2022-05-14 NOTE — Telephone Encounter (Addendum)
PC called the primary number on file but it is not in service. PC called secondary number on file and LVM to schedule initial consult. PC called dialysis center to confirm phone numbers and both original numbers on file were incorrect and are now updated. PC will send interest letter.

## 2022-05-16 ENCOUNTER — Emergency Department: Payer: Self-pay

## 2022-05-17 ENCOUNTER — Inpatient Hospital Stay: Payer: Self-pay

## 2022-05-21 NOTE — Telephone Encounter (Signed)
PC called to make second attempt at scheduling initial consult but primary number on file is not in service and secondary number does not ring. PC will send dead line letter.

## 2022-05-22 ENCOUNTER — Emergency Department: Payer: Self-pay

## 2022-05-22 ENCOUNTER — Inpatient Hospital Stay: Payer: Self-pay

## 2022-06-04 NOTE — Telephone Encounter (Addendum)
PC made final attempt to schedule initial consult, both phone numbers on file do not ring. PC double checked referral but number is the same. PC will send closure letter.

## 2022-06-10 ENCOUNTER — Emergency Department: Payer: Self-pay

## 2022-06-12 ENCOUNTER — Inpatient Hospital Stay: Payer: Self-pay

## 2022-06-30 ENCOUNTER — Emergency Department: Payer: Self-pay

## 2022-06-30 ENCOUNTER — Inpatient Hospital Stay: Payer: Self-pay

## 2022-08-01 ENCOUNTER — Emergency Department: Payer: Self-pay

## 2022-09-23 ENCOUNTER — Emergency Department: Payer: Self-pay

## 2022-09-23 ENCOUNTER — Inpatient Hospital Stay: Payer: Self-pay

## 2022-10-01 ENCOUNTER — Inpatient Hospital Stay: Payer: Self-pay

## 2022-10-01 ENCOUNTER — Emergency Department: Payer: Self-pay

## 2022-10-15 ENCOUNTER — Emergency Department: Payer: Self-pay

## 2022-10-19 ENCOUNTER — Emergency Department: Payer: Self-pay

## 2022-10-20 ENCOUNTER — Emergency Department: Payer: Self-pay

## 2022-10-31 ENCOUNTER — Emergency Department: Payer: Self-pay

## 2022-10-31 ENCOUNTER — Inpatient Hospital Stay: Payer: Self-pay

## 2022-11-07 ENCOUNTER — Inpatient Hospital Stay: Payer: Self-pay

## 2022-12-02 ENCOUNTER — Emergency Department: Payer: Self-pay

## 2022-12-08 ENCOUNTER — Emergency Department: Payer: Self-pay

## 2022-12-08 ENCOUNTER — Inpatient Hospital Stay: Payer: Self-pay

## 2022-12-30 ENCOUNTER — Emergency Department: Payer: Self-pay

## 2022-12-30 ENCOUNTER — Inpatient Hospital Stay: Payer: Self-pay

## 2023-01-15 ENCOUNTER — Emergency Department: Payer: Self-pay

## 2023-01-16 ENCOUNTER — Inpatient Hospital Stay: Payer: Self-pay

## 2023-02-07 ENCOUNTER — Inpatient Hospital Stay: Payer: Self-pay

## 2023-02-07 ENCOUNTER — Emergency Department: Payer: Self-pay

## 2023-03-12 ENCOUNTER — Emergency Department: Payer: Self-pay

## 2023-03-12 ENCOUNTER — Inpatient Hospital Stay: Payer: Self-pay

## 2023-03-29 ENCOUNTER — Inpatient Hospital Stay: Payer: Self-pay

## 2023-03-29 ENCOUNTER — Emergency Department: Payer: Self-pay

## 2023-04-06 ENCOUNTER — Emergency Department: Payer: Self-pay

## 2023-04-06 ENCOUNTER — Inpatient Hospital Stay: Payer: Self-pay

## 2023-04-17 ENCOUNTER — Emergency Department: Payer: Self-pay

## 2023-04-17 ENCOUNTER — Inpatient Hospital Stay: Payer: Self-pay

## 2023-05-01 ENCOUNTER — Inpatient Hospital Stay: Payer: Self-pay

## 2023-05-01 ENCOUNTER — Emergency Department: Payer: Self-pay

## 2023-05-08 ENCOUNTER — Emergency Department: Payer: Self-pay

## 2023-05-08 ENCOUNTER — Inpatient Hospital Stay: Payer: Self-pay

## 2023-05-20 ENCOUNTER — Inpatient Hospital Stay: Payer: Self-pay

## 2023-05-20 ENCOUNTER — Emergency Department: Payer: Self-pay

## 2023-06-08 ENCOUNTER — Inpatient Hospital Stay: Payer: Self-pay

## 2023-06-08 ENCOUNTER — Emergency Department: Payer: Self-pay

## 2023-06-15 ENCOUNTER — Inpatient Hospital Stay: Payer: Self-pay

## 2023-06-15 ENCOUNTER — Emergency Department: Payer: Self-pay

## 2023-07-13 ENCOUNTER — Emergency Department: Payer: Self-pay

## 2023-07-13 ENCOUNTER — Inpatient Hospital Stay: Payer: Self-pay

## 2023-08-01 ENCOUNTER — Emergency Department: Payer: Self-pay

## 2023-08-01 ENCOUNTER — Inpatient Hospital Stay: Payer: Self-pay

## 7436-04-04 DEATH — deceased
